# Patient Record
Sex: Male | Born: 1960 | Race: Black or African American | Hispanic: No | Marital: Single
Health system: Southern US, Community
[De-identification: ages and names within clinical notes are randomized; demographics above are authoritative.]

## PROBLEM LIST (undated history)

## (undated) DIAGNOSIS — I639 Cerebral infarction, unspecified: Secondary | ICD-10-CM

## (undated) DIAGNOSIS — R569 Unspecified convulsions: Secondary | ICD-10-CM

## (undated) DIAGNOSIS — K5909 Other constipation: Secondary | ICD-10-CM

## (undated) DIAGNOSIS — I255 Ischemic cardiomyopathy: Secondary | ICD-10-CM

## (undated) DIAGNOSIS — I10 Essential (primary) hypertension: Secondary | ICD-10-CM

## (undated) DIAGNOSIS — E119 Type 2 diabetes mellitus without complications: Secondary | ICD-10-CM

## (undated) HISTORY — PX: TRANSURETHRAL RESECTION OF PROSTATE: SHX73

---

## 2017-08-21 DIAGNOSIS — R079 Chest pain, unspecified: Secondary | ICD-10-CM

## 2017-08-21 DIAGNOSIS — I10 Essential (primary) hypertension: Secondary | ICD-10-CM

## 2017-08-21 DIAGNOSIS — R0602 Shortness of breath: Secondary | ICD-10-CM

## 2018-05-01 DIAGNOSIS — N412 Abscess of prostate: Secondary | ICD-10-CM | POA: Diagnosis not present

## 2018-05-01 DIAGNOSIS — N390 Urinary tract infection, site not specified: Secondary | ICD-10-CM

## 2018-05-01 DIAGNOSIS — E876 Hypokalemia: Secondary | ICD-10-CM | POA: Diagnosis not present

## 2018-05-01 DIAGNOSIS — N410 Acute prostatitis: Secondary | ICD-10-CM | POA: Diagnosis not present

## 2018-05-01 DIAGNOSIS — Z8673 Personal history of transient ischemic attack (TIA), and cerebral infarction without residual deficits: Secondary | ICD-10-CM

## 2018-05-01 DIAGNOSIS — E871 Hypo-osmolality and hyponatremia: Secondary | ICD-10-CM | POA: Diagnosis not present

## 2018-05-01 DIAGNOSIS — G40909 Epilepsy, unspecified, not intractable, without status epilepticus: Secondary | ICD-10-CM

## 2018-05-01 DIAGNOSIS — I10 Essential (primary) hypertension: Secondary | ICD-10-CM

## 2018-05-02 DIAGNOSIS — N412 Abscess of prostate: Secondary | ICD-10-CM | POA: Diagnosis not present

## 2018-05-02 DIAGNOSIS — E876 Hypokalemia: Secondary | ICD-10-CM | POA: Diagnosis not present

## 2018-05-02 DIAGNOSIS — E871 Hypo-osmolality and hyponatremia: Secondary | ICD-10-CM | POA: Diagnosis not present

## 2018-05-02 DIAGNOSIS — N410 Acute prostatitis: Secondary | ICD-10-CM | POA: Diagnosis not present

## 2018-05-03 DIAGNOSIS — N412 Abscess of prostate: Secondary | ICD-10-CM | POA: Diagnosis not present

## 2018-05-03 DIAGNOSIS — E871 Hypo-osmolality and hyponatremia: Secondary | ICD-10-CM | POA: Diagnosis not present

## 2018-05-03 DIAGNOSIS — E876 Hypokalemia: Secondary | ICD-10-CM | POA: Diagnosis not present

## 2018-05-03 DIAGNOSIS — N410 Acute prostatitis: Secondary | ICD-10-CM | POA: Diagnosis not present

## 2018-05-04 DIAGNOSIS — E871 Hypo-osmolality and hyponatremia: Secondary | ICD-10-CM | POA: Diagnosis not present

## 2018-05-04 DIAGNOSIS — E876 Hypokalemia: Secondary | ICD-10-CM | POA: Diagnosis not present

## 2018-05-04 DIAGNOSIS — N412 Abscess of prostate: Secondary | ICD-10-CM | POA: Diagnosis not present

## 2018-05-04 DIAGNOSIS — N410 Acute prostatitis: Secondary | ICD-10-CM | POA: Diagnosis not present

## 2018-05-05 DIAGNOSIS — E876 Hypokalemia: Secondary | ICD-10-CM | POA: Diagnosis not present

## 2018-05-05 DIAGNOSIS — N412 Abscess of prostate: Secondary | ICD-10-CM | POA: Diagnosis not present

## 2018-05-05 DIAGNOSIS — N410 Acute prostatitis: Secondary | ICD-10-CM | POA: Diagnosis not present

## 2018-05-05 DIAGNOSIS — E871 Hypo-osmolality and hyponatremia: Secondary | ICD-10-CM | POA: Diagnosis not present

## 2018-05-06 DIAGNOSIS — E876 Hypokalemia: Secondary | ICD-10-CM | POA: Diagnosis not present

## 2018-05-06 DIAGNOSIS — N412 Abscess of prostate: Secondary | ICD-10-CM | POA: Diagnosis not present

## 2018-05-06 DIAGNOSIS — E871 Hypo-osmolality and hyponatremia: Secondary | ICD-10-CM | POA: Diagnosis not present

## 2018-05-06 DIAGNOSIS — N410 Acute prostatitis: Secondary | ICD-10-CM | POA: Diagnosis not present

## 2018-05-07 DIAGNOSIS — E876 Hypokalemia: Secondary | ICD-10-CM | POA: Diagnosis not present

## 2018-05-07 DIAGNOSIS — E871 Hypo-osmolality and hyponatremia: Secondary | ICD-10-CM | POA: Diagnosis not present

## 2018-05-07 DIAGNOSIS — N412 Abscess of prostate: Secondary | ICD-10-CM | POA: Diagnosis not present

## 2018-05-07 DIAGNOSIS — N410 Acute prostatitis: Secondary | ICD-10-CM | POA: Diagnosis not present

## 2018-05-08 DIAGNOSIS — E876 Hypokalemia: Secondary | ICD-10-CM | POA: Diagnosis not present

## 2018-05-08 DIAGNOSIS — N412 Abscess of prostate: Secondary | ICD-10-CM | POA: Diagnosis not present

## 2018-05-08 DIAGNOSIS — E871 Hypo-osmolality and hyponatremia: Secondary | ICD-10-CM | POA: Diagnosis not present

## 2018-05-08 DIAGNOSIS — N410 Acute prostatitis: Secondary | ICD-10-CM | POA: Diagnosis not present

## 2019-07-14 ENCOUNTER — Other Ambulatory Visit
Admission: RE | Admit: 2019-07-14 | Discharge: 2019-07-14 | Disposition: A | Discharge status: Home / Self Care | Source: Ambulatory Visit | Attending: Internal Medicine | Admitting: Internal Medicine

## 2019-07-14 LAB — URINALYSIS, COMPLETE (UACMP) WITH MICROSCOPIC
Bacteria, UA: NONE SEEN
Bilirubin Urine: NEGATIVE
Glucose, UA: NEGATIVE mg/dL
Ketones, ur: 80 mg/dL — AB
Leukocytes,Ua: NEGATIVE
Nitrite: NEGATIVE
Protein, ur: NEGATIVE mg/dL
Specific Gravity, Urine: 1.016 (ref 1.005–1.030)
pH: 5 (ref 5.0–8.0)

## 2019-07-14 LAB — CBC WITH DIFFERENTIAL/PLATELET
Abs Immature Granulocytes: 0.03 10*3/uL (ref 0.00–0.07)
Basophils Absolute: 0 10*3/uL (ref 0.0–0.1)
Basophils Relative: 0 %
Eosinophils Absolute: 0.1 10*3/uL (ref 0.0–0.5)
Eosinophils Relative: 2 %
HCT: 36.2 % — ABNORMAL LOW (ref 39.0–52.0)
Hemoglobin: 11.6 g/dL — ABNORMAL LOW (ref 13.0–17.0)
Immature Granulocytes: 0 %
Lymphocytes Relative: 19 %
Lymphs Abs: 1.4 10*3/uL (ref 0.7–4.0)
MCH: 27.3 pg (ref 26.0–34.0)
MCHC: 32 g/dL (ref 30.0–36.0)
MCV: 85.2 fL (ref 80.0–100.0)
Monocytes Absolute: 0.8 10*3/uL (ref 0.1–1.0)
Monocytes Relative: 10 %
Neutro Abs: 5.2 10*3/uL (ref 1.7–7.7)
Neutrophils Relative %: 69 %
Platelets: 385 10*3/uL (ref 150–400)
RBC: 4.25 MIL/uL (ref 4.22–5.81)
RDW: 14.4 % (ref 11.5–15.5)
WBC: 7.5 10*3/uL (ref 4.0–10.5)
nRBC: 0 % (ref 0.0–0.2)

## 2019-07-14 LAB — COMPREHENSIVE METABOLIC PANEL
ALT: 11 U/L (ref 0–44)
AST: 10 U/L — ABNORMAL LOW (ref 15–41)
Albumin: 3.2 g/dL — ABNORMAL LOW (ref 3.5–5.0)
Alkaline Phosphatase: 89 U/L (ref 38–126)
Anion gap: 17 — ABNORMAL HIGH (ref 5–15)
BUN: 10 mg/dL (ref 6–20)
CO2: 24 mmol/L (ref 22–32)
Calcium: 8.8 mg/dL — ABNORMAL LOW (ref 8.9–10.3)
Chloride: 95 mmol/L — ABNORMAL LOW (ref 98–111)
Creatinine, Ser: 0.34 mg/dL — ABNORMAL LOW (ref 0.61–1.24)
GFR calc Af Amer: >60 mL/min (ref >60)
GFR calc non Af Amer: >60 mL/min (ref >60)
Glucose, Bld: 31 mg/dL — CL (ref 70–99)
Potassium: 3.7 mmol/L (ref 3.5–5.1)
Sodium: 136 mmol/L (ref 135–145)
Total Bilirubin: 0.9 mg/dL (ref 0.3–1.2)
Total Protein: 6.8 g/dL (ref 6.5–8.1)

## 2019-09-03 DIAGNOSIS — R569 Unspecified convulsions: Secondary | ICD-10-CM | POA: Diagnosis not present

## 2019-09-03 DIAGNOSIS — A419 Sepsis, unspecified organism: Secondary | ICD-10-CM | POA: Diagnosis not present

## 2019-09-03 DIAGNOSIS — J189 Pneumonia, unspecified organism: Secondary | ICD-10-CM | POA: Diagnosis not present

## 2019-09-03 DIAGNOSIS — J9621 Acute and chronic respiratory failure with hypoxia: Secondary | ICD-10-CM | POA: Diagnosis not present

## 2019-09-03 DIAGNOSIS — I255 Ischemic cardiomyopathy: Secondary | ICD-10-CM

## 2019-09-04 DIAGNOSIS — J9621 Acute and chronic respiratory failure with hypoxia: Secondary | ICD-10-CM | POA: Diagnosis not present

## 2019-09-04 DIAGNOSIS — I255 Ischemic cardiomyopathy: Secondary | ICD-10-CM

## 2019-09-04 DIAGNOSIS — R569 Unspecified convulsions: Secondary | ICD-10-CM | POA: Diagnosis not present

## 2019-09-04 DIAGNOSIS — A419 Sepsis, unspecified organism: Secondary | ICD-10-CM | POA: Diagnosis not present

## 2019-09-04 DIAGNOSIS — J189 Pneumonia, unspecified organism: Secondary | ICD-10-CM | POA: Diagnosis not present

## 2019-09-05 DIAGNOSIS — R569 Unspecified convulsions: Secondary | ICD-10-CM | POA: Diagnosis not present

## 2019-09-05 DIAGNOSIS — J189 Pneumonia, unspecified organism: Secondary | ICD-10-CM | POA: Diagnosis not present

## 2019-09-05 DIAGNOSIS — J9621 Acute and chronic respiratory failure with hypoxia: Secondary | ICD-10-CM | POA: Diagnosis not present

## 2019-09-05 DIAGNOSIS — I255 Ischemic cardiomyopathy: Secondary | ICD-10-CM

## 2019-09-05 DIAGNOSIS — A419 Sepsis, unspecified organism: Secondary | ICD-10-CM | POA: Diagnosis not present

## 2019-09-06 DIAGNOSIS — R569 Unspecified convulsions: Secondary | ICD-10-CM | POA: Diagnosis not present

## 2019-09-06 DIAGNOSIS — A419 Sepsis, unspecified organism: Secondary | ICD-10-CM | POA: Diagnosis not present

## 2019-09-06 DIAGNOSIS — J189 Pneumonia, unspecified organism: Secondary | ICD-10-CM | POA: Diagnosis not present

## 2019-09-06 DIAGNOSIS — J9621 Acute and chronic respiratory failure with hypoxia: Secondary | ICD-10-CM | POA: Diagnosis not present

## 2019-09-06 DIAGNOSIS — I255 Ischemic cardiomyopathy: Secondary | ICD-10-CM

## 2019-09-07 DIAGNOSIS — J9621 Acute and chronic respiratory failure with hypoxia: Secondary | ICD-10-CM | POA: Diagnosis not present

## 2019-09-07 DIAGNOSIS — R569 Unspecified convulsions: Secondary | ICD-10-CM | POA: Diagnosis not present

## 2019-09-07 DIAGNOSIS — J189 Pneumonia, unspecified organism: Secondary | ICD-10-CM | POA: Diagnosis not present

## 2019-09-07 DIAGNOSIS — A419 Sepsis, unspecified organism: Secondary | ICD-10-CM | POA: Diagnosis not present

## 2019-09-07 DIAGNOSIS — I255 Ischemic cardiomyopathy: Secondary | ICD-10-CM

## 2019-09-08 DIAGNOSIS — J9621 Acute and chronic respiratory failure with hypoxia: Secondary | ICD-10-CM | POA: Diagnosis not present

## 2019-09-08 DIAGNOSIS — I255 Ischemic cardiomyopathy: Secondary | ICD-10-CM

## 2019-09-08 DIAGNOSIS — J189 Pneumonia, unspecified organism: Secondary | ICD-10-CM | POA: Diagnosis not present

## 2019-09-08 DIAGNOSIS — A419 Sepsis, unspecified organism: Secondary | ICD-10-CM | POA: Diagnosis not present

## 2019-09-08 DIAGNOSIS — R569 Unspecified convulsions: Secondary | ICD-10-CM | POA: Diagnosis not present

## 2020-04-01 ENCOUNTER — Emergency Department (HOSPITAL_COMMUNITY)

## 2020-04-01 ENCOUNTER — Other Ambulatory Visit: Payer: Self-pay

## 2020-04-01 ENCOUNTER — Emergency Department (HOSPITAL_COMMUNITY)
Admission: EM | Admit: 2020-04-01 | Discharge: 2020-04-01 | Disposition: A | Discharge status: Other Acute Inpatient Hospital | Attending: Emergency Medicine | Admitting: Emergency Medicine

## 2020-04-01 ENCOUNTER — Encounter (HOSPITAL_COMMUNITY): Payer: Self-pay | Admitting: *Deleted

## 2020-04-01 DIAGNOSIS — E119 Type 2 diabetes mellitus without complications: Secondary | ICD-10-CM | POA: Diagnosis not present

## 2020-04-01 DIAGNOSIS — I639 Cerebral infarction, unspecified: Secondary | ICD-10-CM

## 2020-04-01 DIAGNOSIS — H5789 Other specified disorders of eye and adnexa: Secondary | ICD-10-CM | POA: Diagnosis not present

## 2020-04-01 DIAGNOSIS — R531 Weakness: Secondary | ICD-10-CM | POA: Diagnosis not present

## 2020-04-01 DIAGNOSIS — H538 Other visual disturbances: Secondary | ICD-10-CM | POA: Insufficient documentation

## 2020-04-01 DIAGNOSIS — I10 Essential (primary) hypertension: Secondary | ICD-10-CM | POA: Insufficient documentation

## 2020-04-01 DIAGNOSIS — Z20822 Contact with and (suspected) exposure to covid-19: Secondary | ICD-10-CM | POA: Insufficient documentation

## 2020-04-01 HISTORY — DX: Other constipation: K59.09

## 2020-04-01 HISTORY — DX: Type 2 diabetes mellitus without complications: E11.9

## 2020-04-01 HISTORY — DX: Essential (primary) hypertension: I10

## 2020-04-01 HISTORY — DX: Cerebral infarction, unspecified: I63.9

## 2020-04-01 HISTORY — DX: Ischemic cardiomyopathy: I25.5

## 2020-04-01 HISTORY — DX: Unspecified convulsions: R56.9

## 2020-04-01 LAB — COMPREHENSIVE METABOLIC PANEL
ALT: 33 U/L (ref 0–44)
AST: 19 U/L (ref 15–41)
Albumin: 3.4 g/dL — ABNORMAL LOW (ref 3.5–5.0)
Alkaline Phosphatase: 86 U/L (ref 38–126)
Anion gap: 9 (ref 5–15)
BUN: 13 mg/dL (ref 6–20)
CO2: 32 mmol/L (ref 22–32)
Calcium: 9.2 mg/dL (ref 8.9–10.3)
Chloride: 97 mmol/L — ABNORMAL LOW (ref 98–111)
Creatinine, Ser: 0.31 mg/dL — ABNORMAL LOW (ref 0.61–1.24)
GFR calc Af Amer: >60 mL/min (ref >60)
GFR calc non Af Amer: >60 mL/min (ref >60)
Glucose, Bld: 93 mg/dL (ref 70–99)
Potassium: 4.3 mmol/L (ref 3.5–5.1)
Sodium: 138 mmol/L (ref 135–145)
Total Bilirubin: 0.5 mg/dL (ref 0.3–1.2)
Total Protein: 7.1 g/dL (ref 6.5–8.1)

## 2020-04-01 LAB — APTT: aPTT: 45 seconds — ABNORMAL HIGH (ref 24–36)

## 2020-04-01 LAB — DIFFERENTIAL
Abs Immature Granulocytes: 0.02 10*3/uL (ref 0.00–0.07)
Basophils Absolute: 0 10*3/uL (ref 0.0–0.1)
Basophils Relative: 0 %
Eosinophils Absolute: 0.2 10*3/uL (ref 0.0–0.5)
Eosinophils Relative: 2 %
Immature Granulocytes: 0 %
Lymphocytes Relative: 30 %
Lymphs Abs: 2 10*3/uL (ref 0.7–4.0)
Monocytes Absolute: 0.5 10*3/uL (ref 0.1–1.0)
Monocytes Relative: 8 %
Neutro Abs: 4 10*3/uL (ref 1.7–7.7)
Neutrophils Relative %: 60 %

## 2020-04-01 LAB — CBC
HCT: 39.8 % (ref 39.0–52.0)
Hemoglobin: 12 g/dL — ABNORMAL LOW (ref 13.0–17.0)
MCH: 26.7 pg (ref 26.0–34.0)
MCHC: 30.2 g/dL (ref 30.0–36.0)
MCV: 88.4 fL (ref 80.0–100.0)
Platelets: 198 10*3/uL (ref 150–400)
RBC: 4.5 MIL/uL (ref 4.22–5.81)
RDW: 15 % (ref 11.5–15.5)
WBC: 6.7 10*3/uL (ref 4.0–10.5)
nRBC: 0 % (ref 0.0–0.2)

## 2020-04-01 LAB — I-STAT CHEM 8, ED
BUN: 14 mg/dL (ref 6–20)
Calcium, Ion: 1.13 mmol/L — ABNORMAL LOW (ref 1.15–1.40)
Chloride: 96 mmol/L — ABNORMAL LOW (ref 98–111)
Creatinine, Ser: 0.3 mg/dL — ABNORMAL LOW (ref 0.61–1.24)
Glucose, Bld: 89 mg/dL (ref 70–99)
HCT: 39 % (ref 39.0–52.0)
Hemoglobin: 13.3 g/dL (ref 13.0–17.0)
Potassium: 4.2 mmol/L (ref 3.5–5.1)
Sodium: 139 mmol/L (ref 135–145)
TCO2: 31 mmol/L (ref 22–32)

## 2020-04-01 LAB — PROTIME-INR
INR: 1.1 (ref 0.8–1.2)
Prothrombin Time: 13.3 seconds (ref 11.4–15.2)

## 2020-04-01 LAB — SARS CORONAVIRUS 2 BY RT PCR (HOSPITAL ORDER, PERFORMED IN ~~LOC~~ HOSPITAL LAB): SARS Coronavirus 2: NEGATIVE

## 2020-04-01 LAB — CBG MONITORING, ED: Glucose-Capillary: 97 mg/dL (ref 70–99)

## 2020-04-01 IMAGING — MR MR MRA NECK WO/W CM
5 of 9 series · 27 of 48 positions shown · IV contrast (gadavist)
Comparison: CT head [DATE]

CLINICAL DATA: Acute neurologic deficit.  Left-sided weakness.

EXAM:
MR HEAD WITHOUT CONTRAST
MR CIRCLE OF WILLIS WITHOUT CONTRAST
MRA OF THE NECK WITHOUT AND WITH CONTRAST
TECHNIQUE: Multiplanar, multiecho pulse sequences of the brain, circle of
willis and surrounding structures were obtained without intravenous
contrast. Angiographic images of the neck were obtained using MRA
technique without and with intravenous contrast.
CONTRAST:  6.8mL GADAVIST GADOBUTROL 1 MMOL/ML IV SOLN

[Series 8: tof_fl3d_tra_iso · axial · B · 0.6mm · 0.52mm/px · z∈[-165,-86]mm · 8 of 133 slices shown]
[im 1/133]
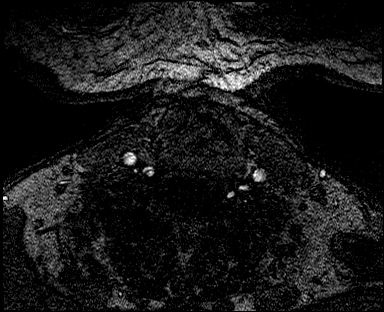
[im 19/133]
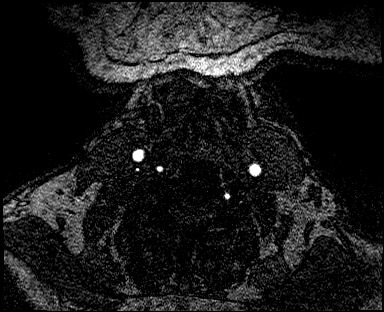
[im 38/133]
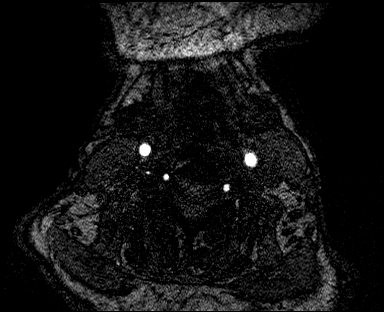
[im 57/133]
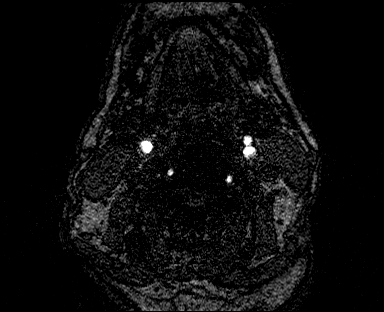
[im 76/133]
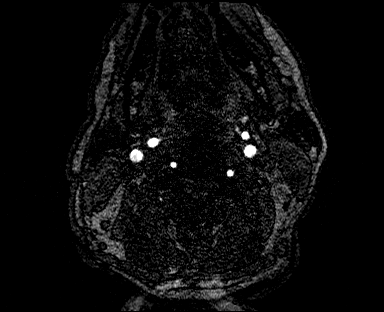
[im 95/133]
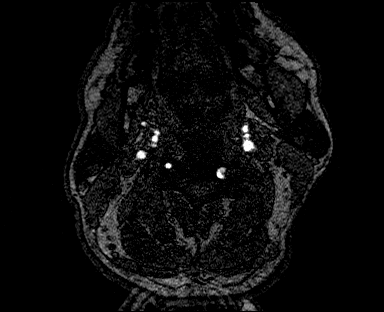
[im 114/133]
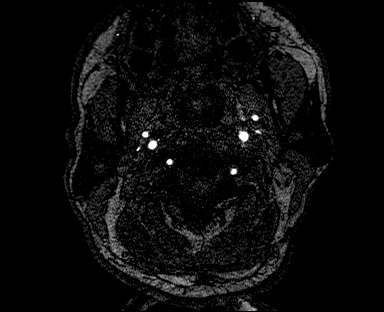
[im 133/133]
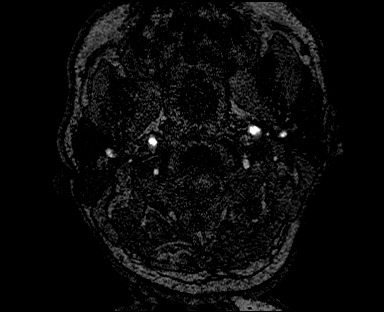

[Series 11: angio_fl3d_cor_highres_pre_ttc=3.0s · coronal · B · 0.9mm · 0.62mm/px · 5 of 88 slices shown]
[im 1/88]
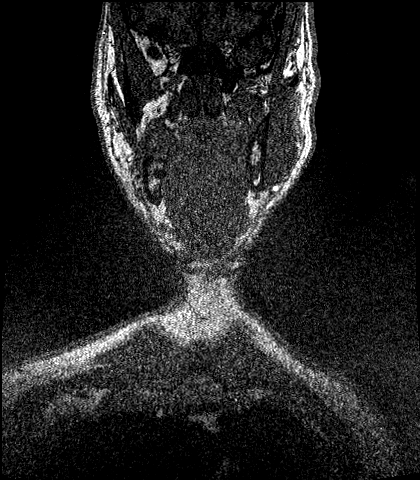
[im 22/88]
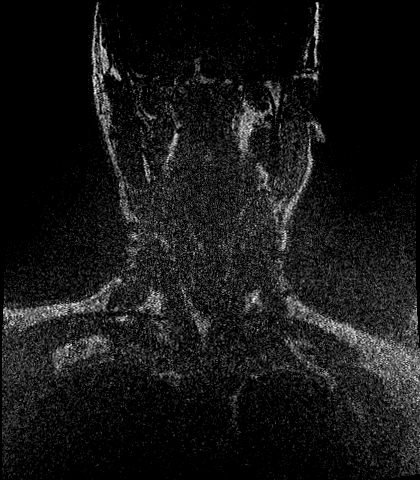
[im 44/88]
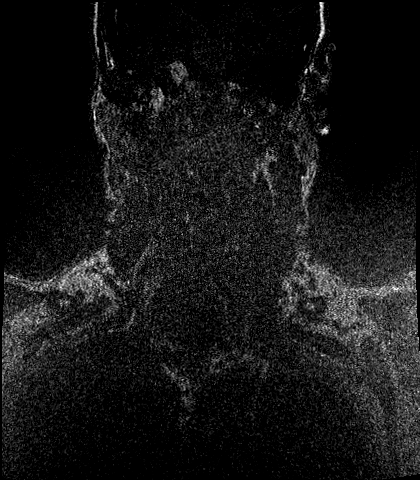
[im 66/88]
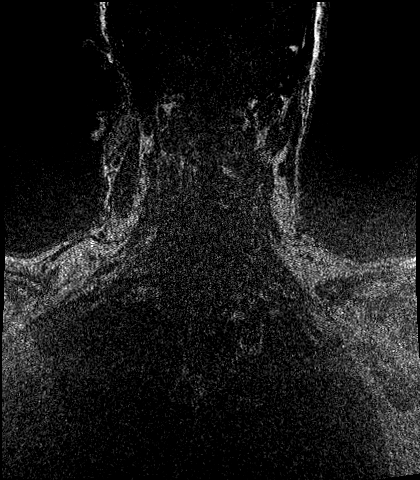
[im 88/88]
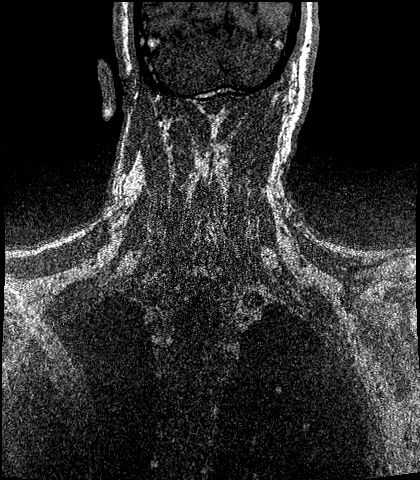

[Series 13: angio_fl3d_cor_highres_post_ttc=3.0s · coronal · B · 0.9mm · 0.62mm/px · 5 of 88 slices shown]
[im 1/88]
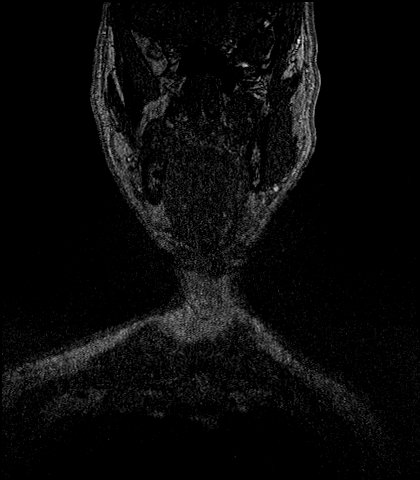
[im 22/88]
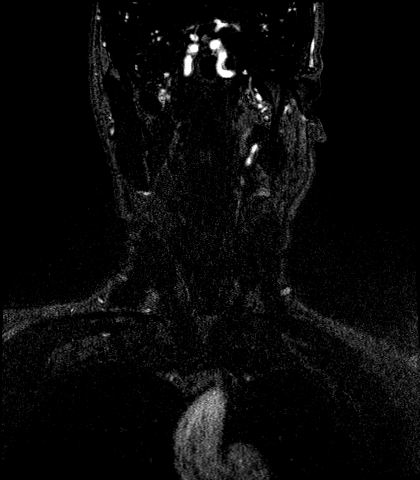
[im 44/88]
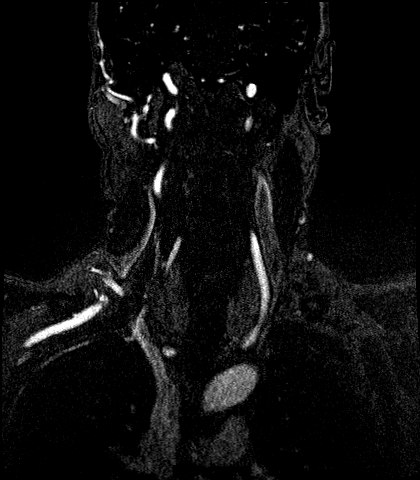
[im 66/88]
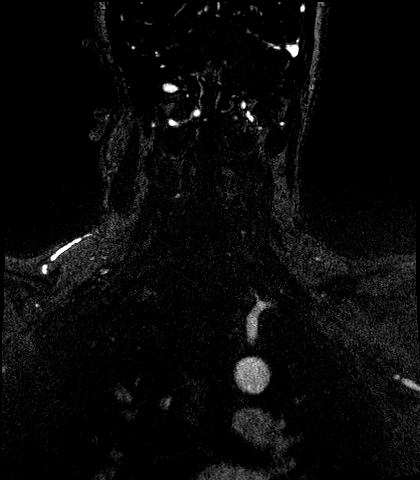
[im 88/88]
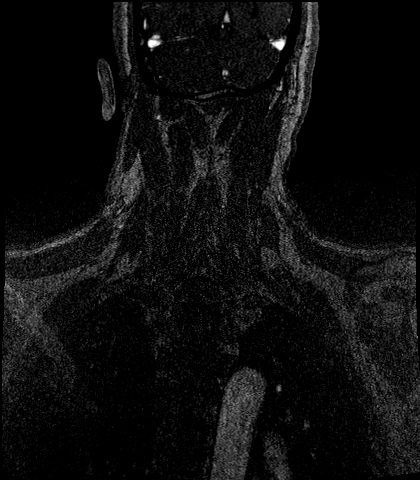

[Series 14: angio_fl3d_cor_highres_post_ttc=3.0s_moco-adv · coronal · B · 0.9mm · 0.62mm/px · 5 of 88 slices shown]
[im 1/88]
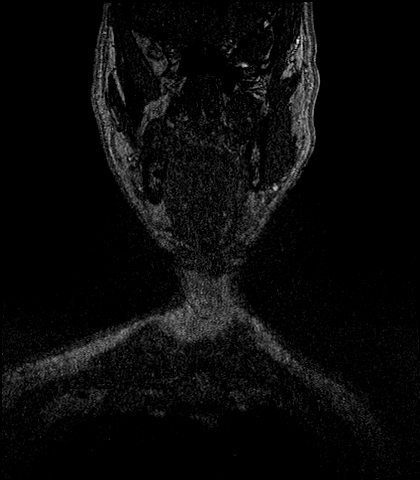
[im 22/88]
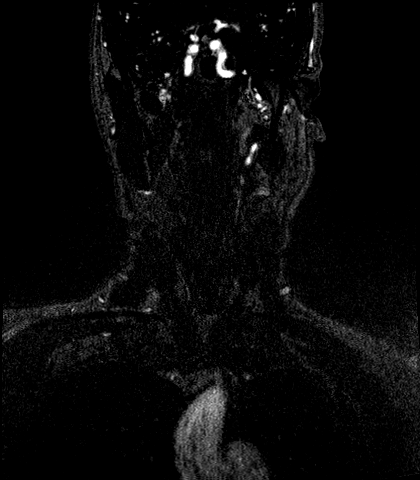
[im 44/88]
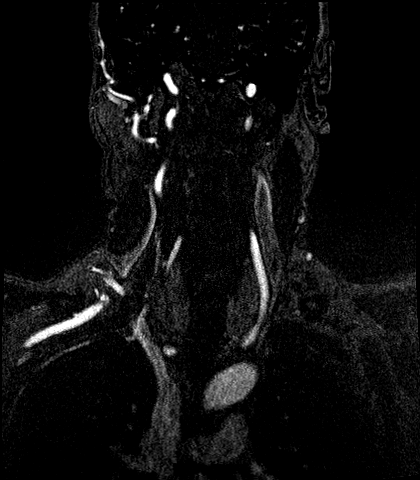
[im 66/88]
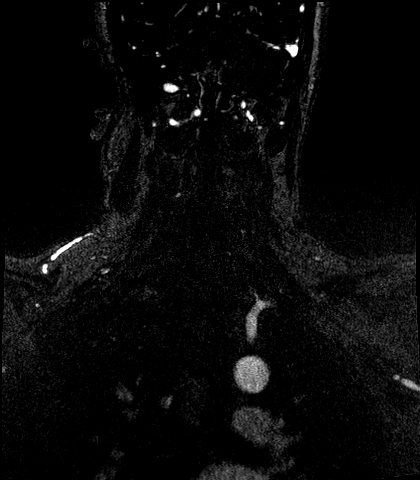
[im 88/88]
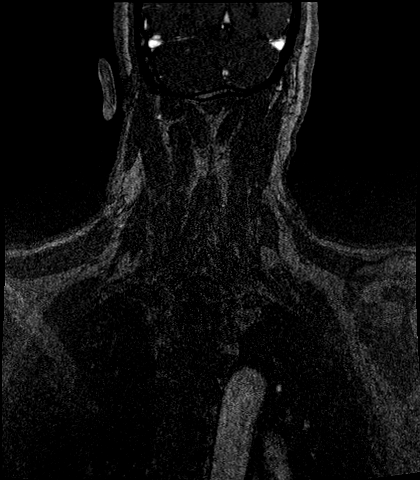

[Series 15: angio_fl3d_cor_highres_post_ttc=3.0s_moco-adv_sub · coronal · B · 0.9mm · 0.62mm/px · 4 of 88 slices shown]
[im 1/88]
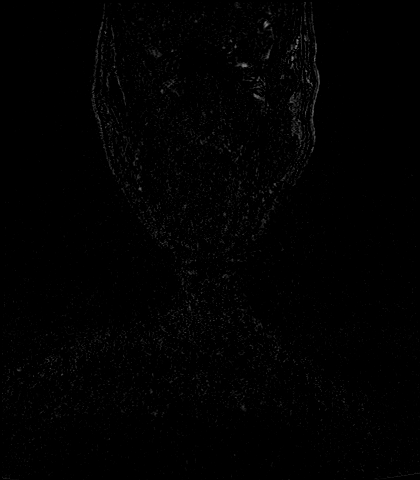
[im 18/88]
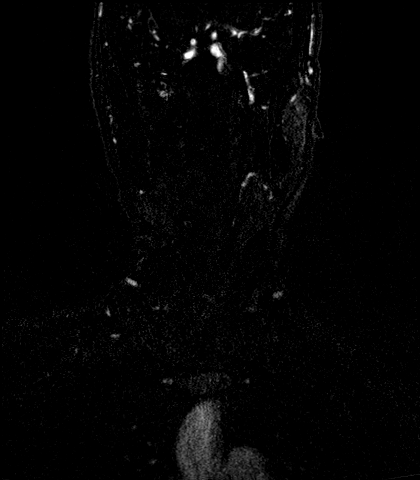
[im 35/88]
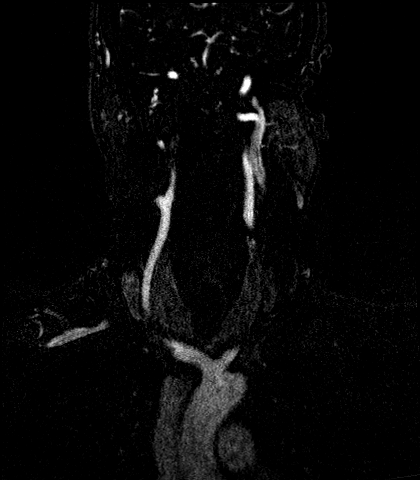
[im 53/88]
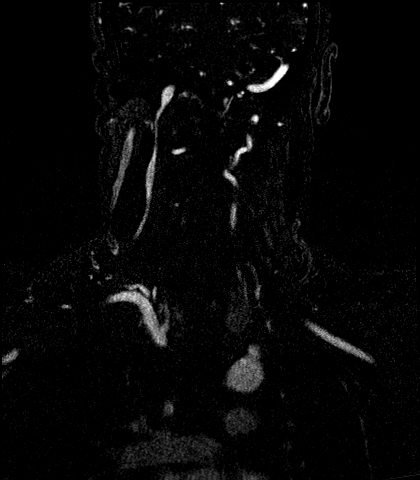

[27 of 48 positions shown; findings below may reference images not displayed]

FINDINGS: MR HEAD FINDINGS

Brain: Negative for acute infarct.

Moderate atrophy without hydrocephalus. Periventricular and deep
white matter hyperintensity bilaterally compatible with chronic
white matter disease. The patient has diabetes and hypertension in
this is most likely chronic microvascular ischemia although
demyelinating disease is in the differential. Negative for
hemorrhage or mass.

Vascular: Normal arterial flow voids

Skull and upper cervical spine: No focal skeletal lesion. ACDF at
C3-4.

Sinuses/Orbits: Mild mucosal edema paranasal sinuses. Negative orbit

Other: None

MR CIRCLE OF WILLIS FINDINGS

Both vertebral arteries widely patent to the basilar. PICA patent
bilaterally. Basilar patent. Left AICA patent. Bilateral superior
cerebellar and posterior cerebral arteries widely patent. Fetal
origin right posterior cerebral artery.

Internal carotid artery normal bilaterally without stenosis or
aneurysm. Middle cerebral artery widely patent bilaterally. Both
anterior cerebral arteries patent. Left anterior cerebral artery
large on the right. There is a mild to moderate stenosis of the
right A2 segment.

MRA NECK FINDINGS

Normal aortic arch with bovine branching. Proximal great vessels
widely patent.

Carotid bifurcation widely patent bilaterally without stenosis or
irregularity.

Both vertebral arteries widely patent without stenosis.
IMPRESSION: 1. Negative for acute infarct
2. Moderate atrophy. Extensive periventricular deep white matter
hyperintensities most likely chronic microvascular ischemia given
history of diabetes and hypertension.
3. Negative MRA neck.
4. Moderate stenosis right A2 segment. Remainder of the
circle-of-Willis is widely patent.

## 2020-04-01 IMAGING — CT CT HEAD CODE STROKE
3 series · 14 of 47 positions shown, 16 images · non-contrast
Comparison: Head CT [DATE].

CLINICAL DATA: Code stroke. 59-year-old male with left side
weakness. Last seen well at noon.

EXAM:
CT HEAD WITHOUT CONTRAST
TECHNIQUE: Contiguous axial images were obtained from the base of the skull
through the vertex without intravenous contrast.

[Series 3: head 5.0 h30s · axial · 0.41mm/px · z∈[-146,-16]mm · 8 of 32 slices shown, 10 images]
[im 3/32  brain]
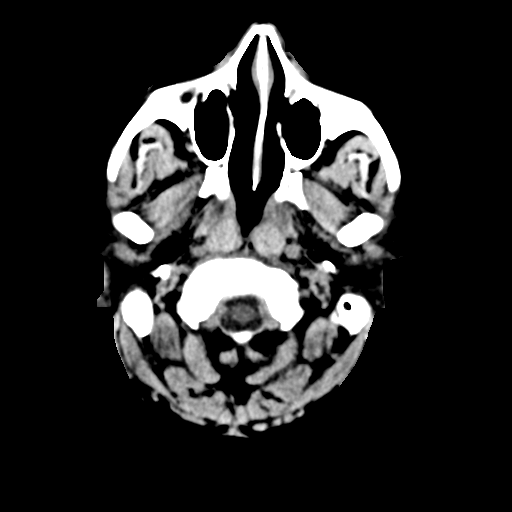
[im 3/32  bone]
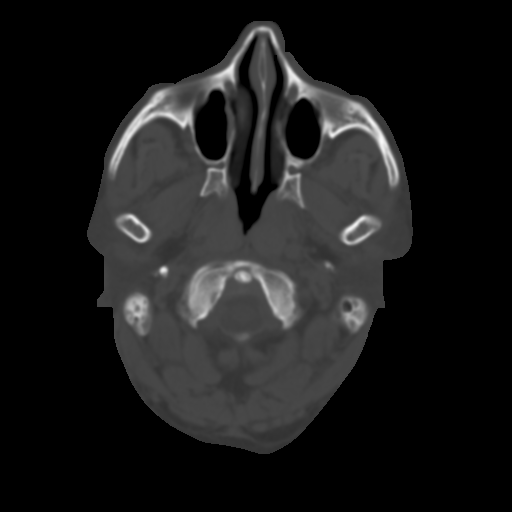
[im 7/32  brain]
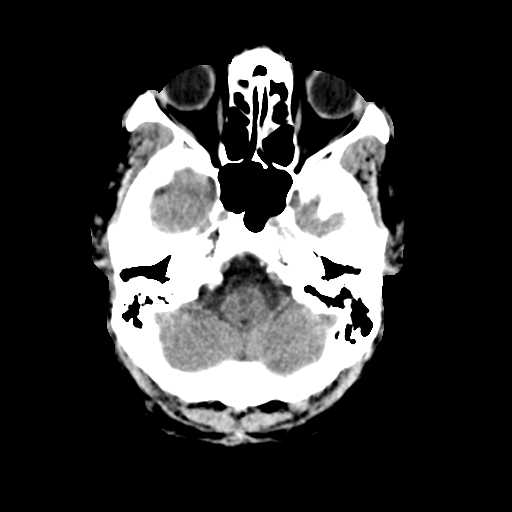
[im 10/32  brain]
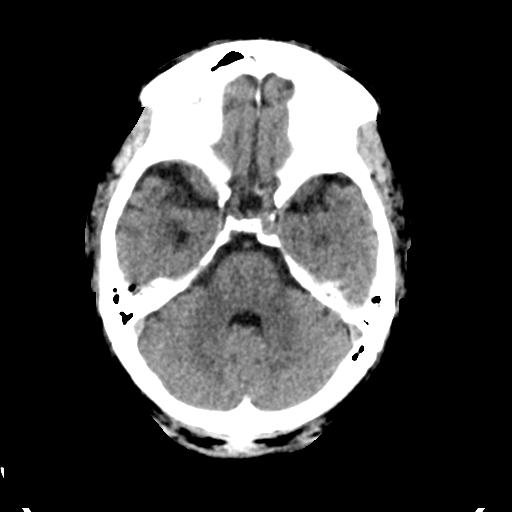
[im 14/32  brain]
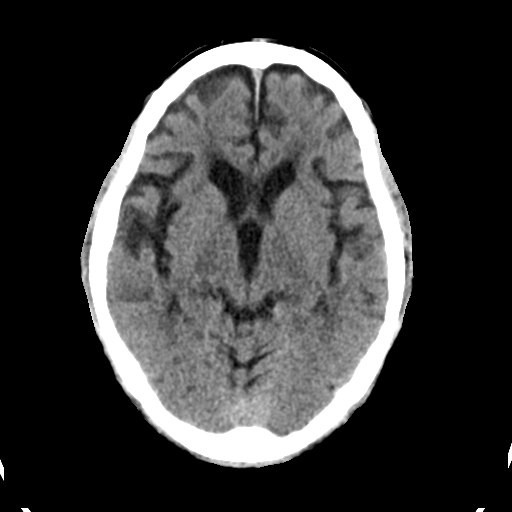
[im 18/32  brain]
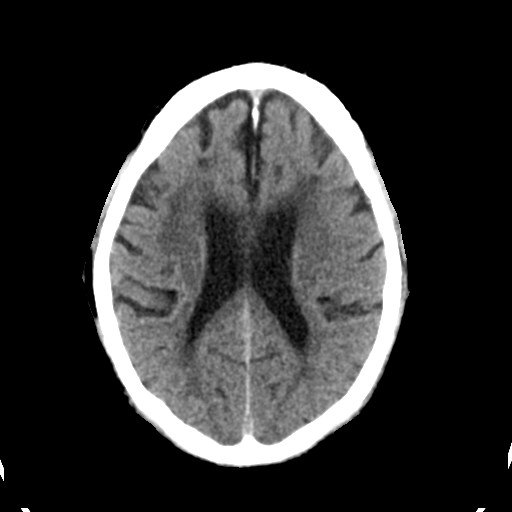
[im 18/32  bone]
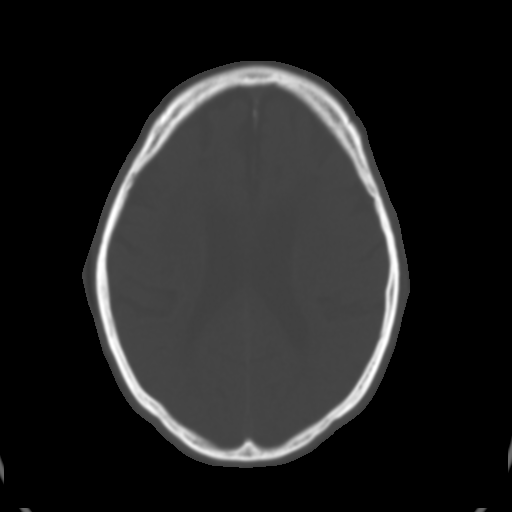
[im 22/32  brain]
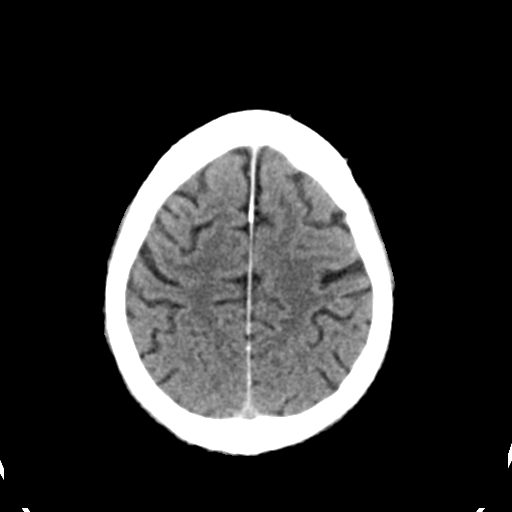
[im 25/32  brain]
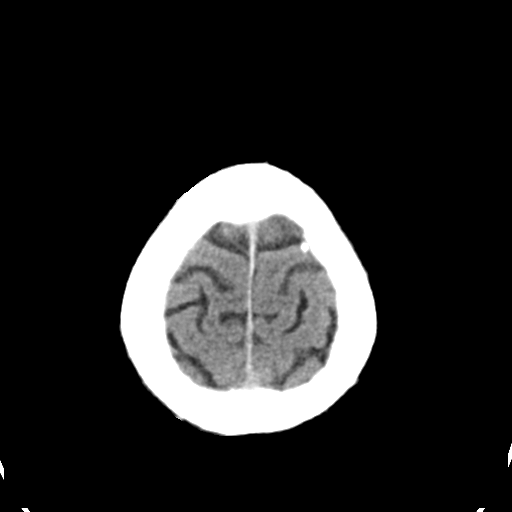
[im 29/32  brain]
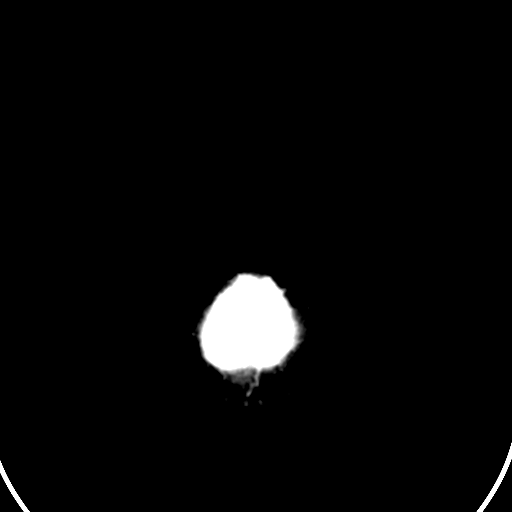

[Series 5: head 3.0 mpr cor · coronal · 0.30mm/px · 3 of 67 slices shown]
[im 23/67  brain]
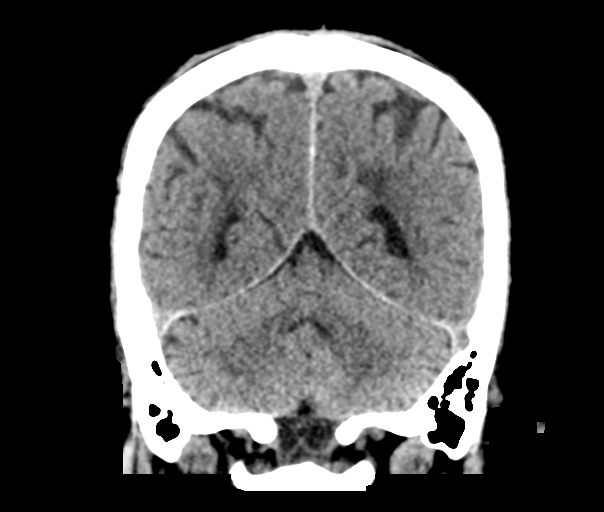
[im 30/67  brain]
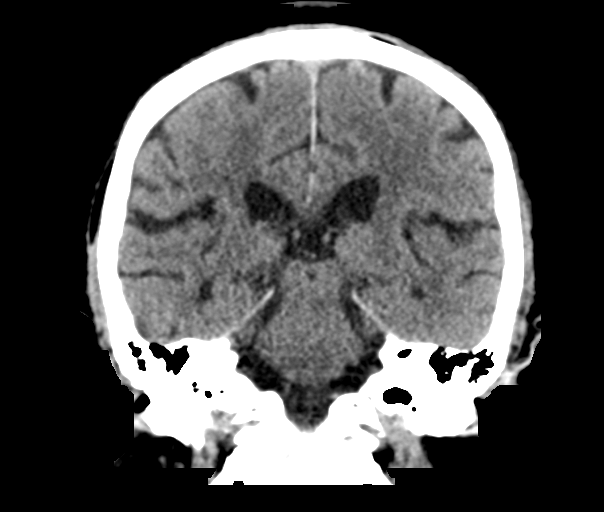
[im 37/67  brain]
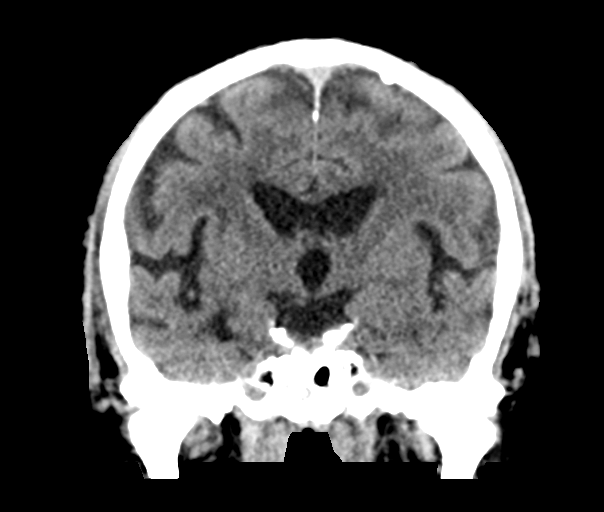

[Series 6: head 3.0 mpr sag · sagittal · 0.30mm/px · 3 of 61 slices shown]
[im 21/61  brain]
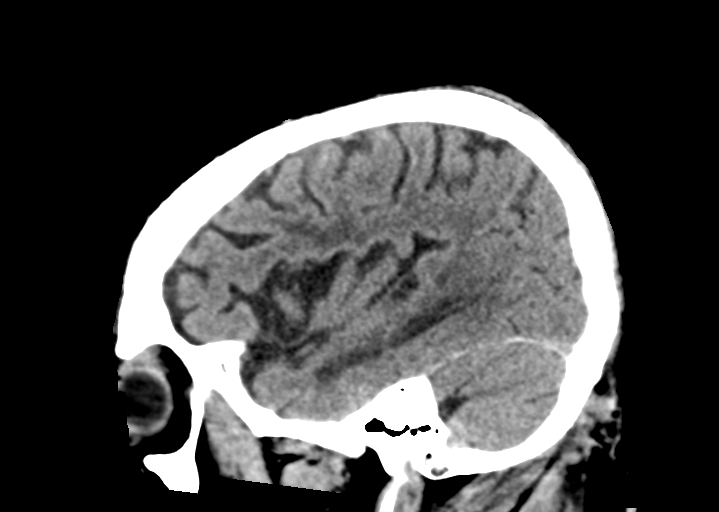
[im 31/61  brain]
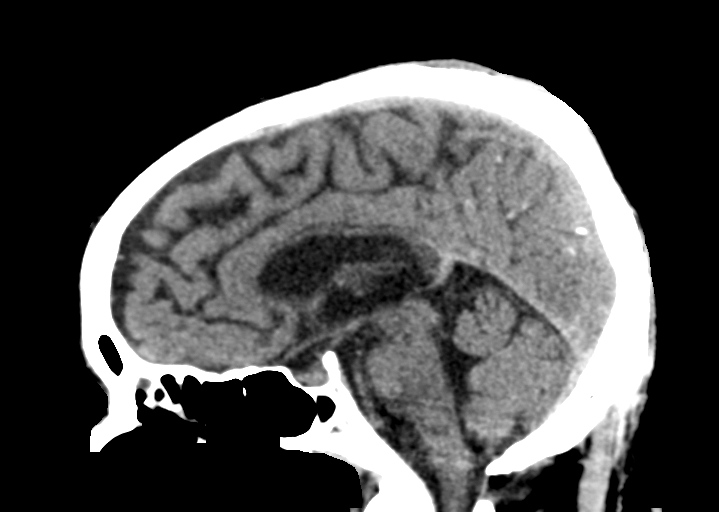
[im 41/61  brain]
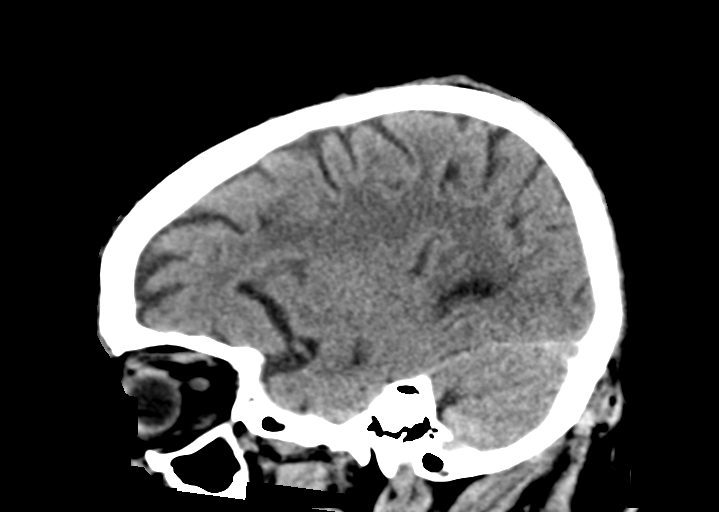

[14 of 47 positions shown; findings below may reference images not displayed]

FINDINGS: Brain: Stable mild dural calcification. No midline shift, mass
effect, or evidence of intracranial mass lesion. No
ventriculomegaly.

No acute intracranial hemorrhage identified. No cortically based
acute infarct identified. Increased patchy and scattered bilateral
white matter hypodensity since [XT], including some deep white
matter capsule involvement in both hemispheres. No cortical
encephalomalacia identified.

Vascular: No suspicious intracranial vascular hyperdensity.

Skull: No acute osseous abnormality identified.

Sinuses/Orbits: Mild new ethmoid sinus mucosal thickening. Other
paranasal sinuses are stable and well pneumatized. Chronic right
mastoid effusion is stable. Tympanic cavities remain clear.

Other: No acute orbit or scalp soft tissue finding.

ASPECTS ([place] Stroke Program Early CT Score)

Total score (0-10 with 10 being normal): 10
IMPRESSION: 1. No acute cortically based infarct or acute intracranial
hemorrhage identified. ASPECTS 10.
2. Increased cerebral white matter disease since [XT], most commonly
due to chronic small vessel disease.
3. These results were communicated to Dr. ANET at [DATE] on
[DATE] by text page via the [employer] messaging system.

## 2020-04-01 IMAGING — MR MR MRA HEAD W/O CM
1 series · 19 of 48 positions shown · IV contrast (gadavist)
Comparison: CT head [DATE]

CLINICAL DATA: Acute neurologic deficit.  Left-sided weakness.

EXAM:
MR HEAD WITHOUT CONTRAST
MR CIRCLE OF WILLIS WITHOUT CONTRAST
MRA OF THE NECK WITHOUT AND WITH CONTRAST
TECHNIQUE: Multiplanar, multiecho pulse sequences of the brain, circle of
willis and surrounding structures were obtained without intravenous
contrast. Angiographic images of the neck were obtained using MRA
technique without and with intravenous contrast.
CONTRAST:  6.8mL GADAVIST GADOBUTROL 1 MMOL/ML IV SOLN

[Series 5: 3d cow · axial · 0.5mm · 0.41mm/px · z∈[-99,-19]mm · 19 of 172 slices shown]
[im 1/172]
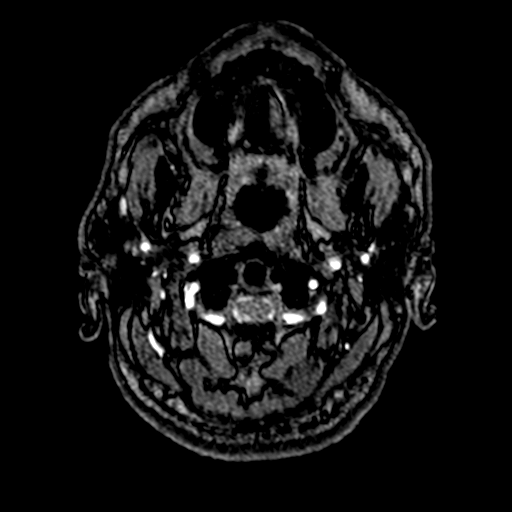
[im 4/172]
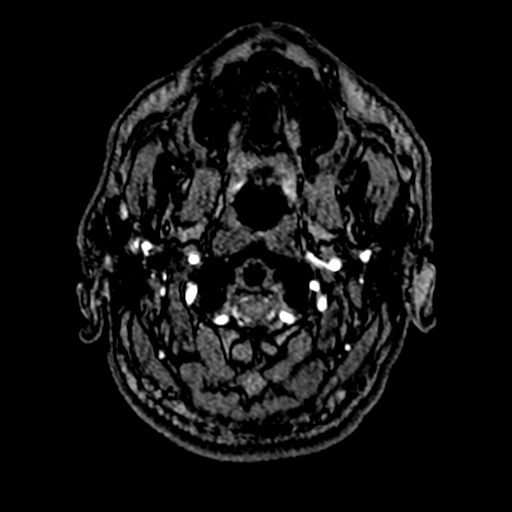
[im 8/172]
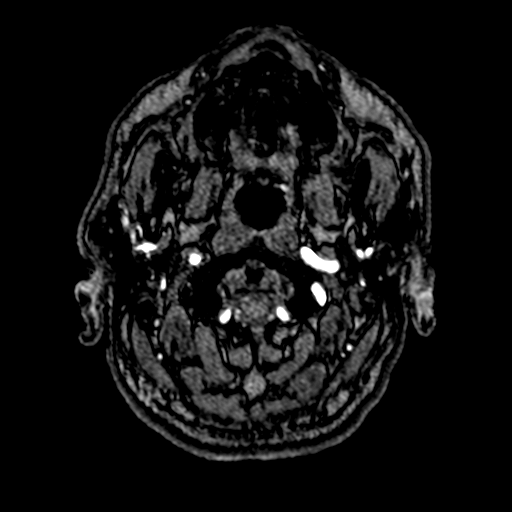
[im 11/172]
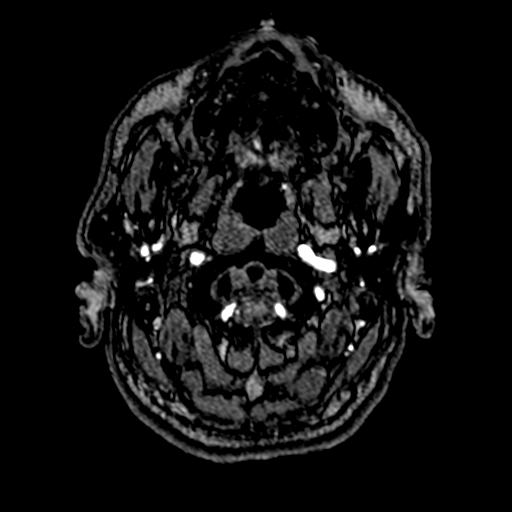
[im 15/172]
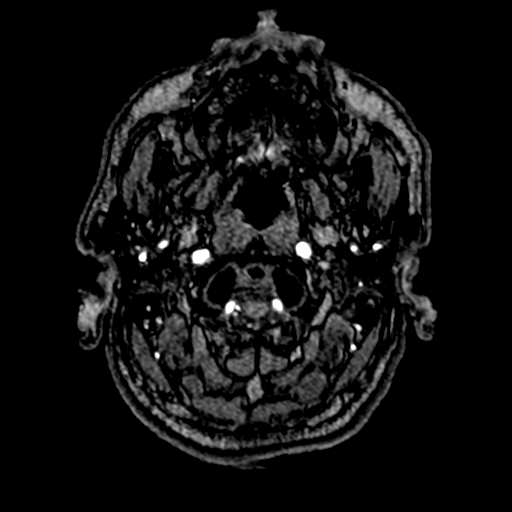
[im 19/172]
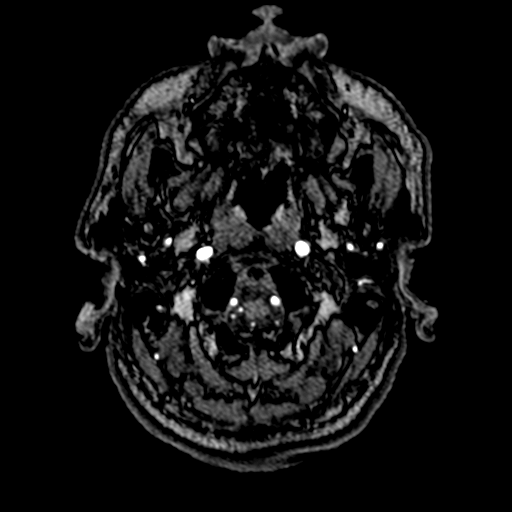
[im 22/172]
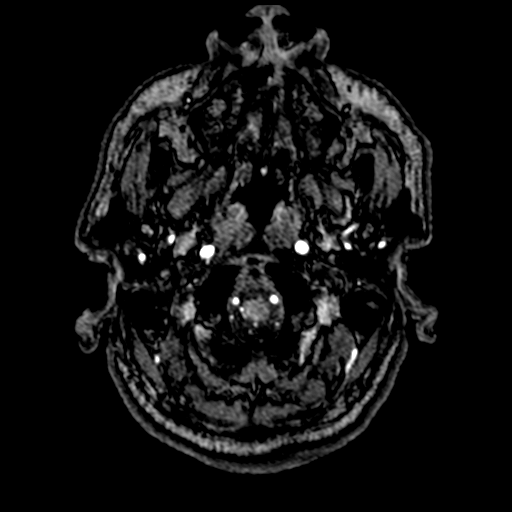
[im 26/172]
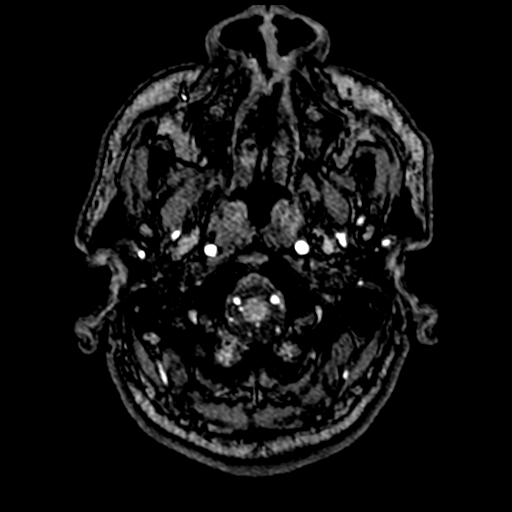
[im 30/172]
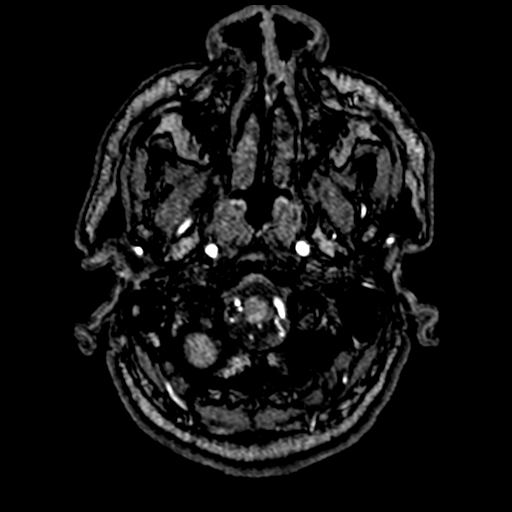
[im 33/172]
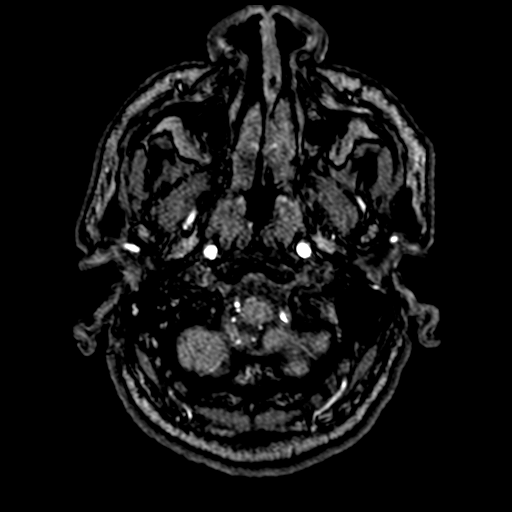
[im 37/172]
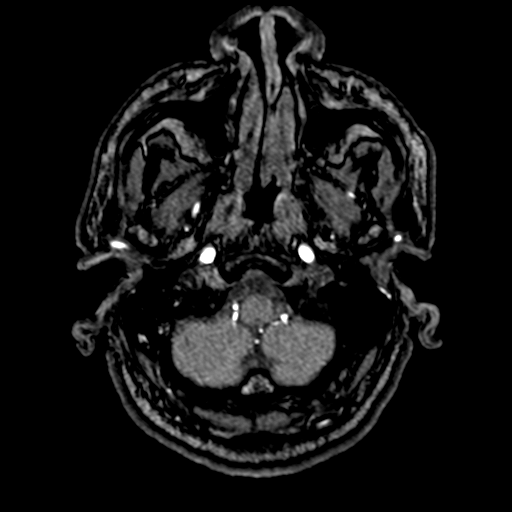
[im 55/172]
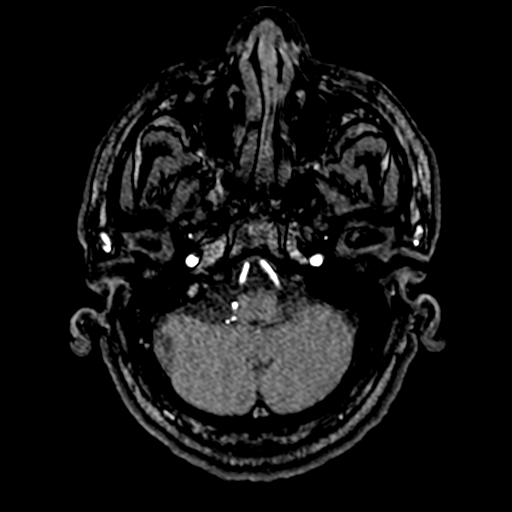
[im 77/172]
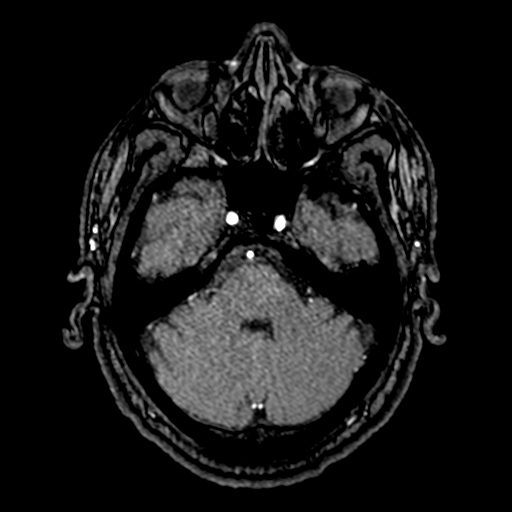
[im 88/172]
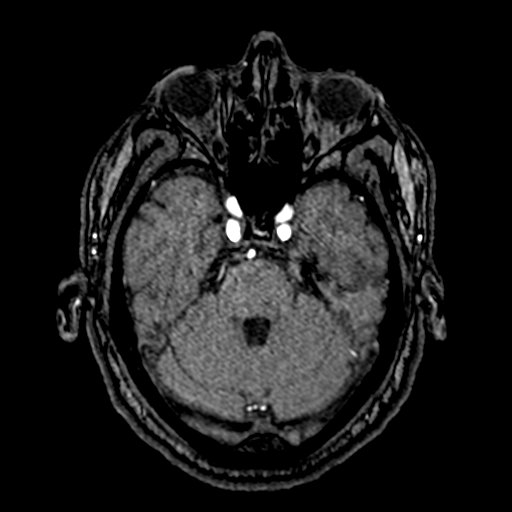
[im 99/172]
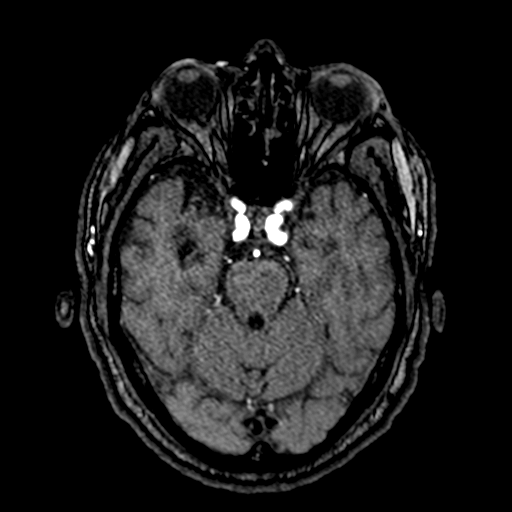
[im 121/172]
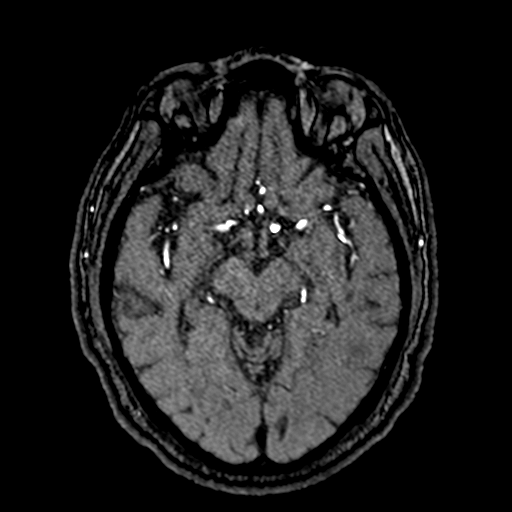
[im 142/172]
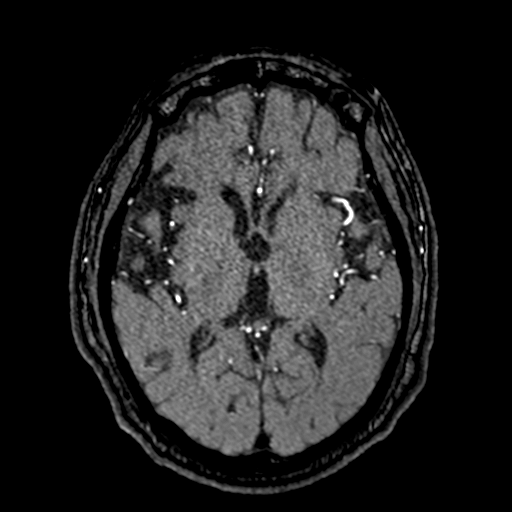
[im 146/172]
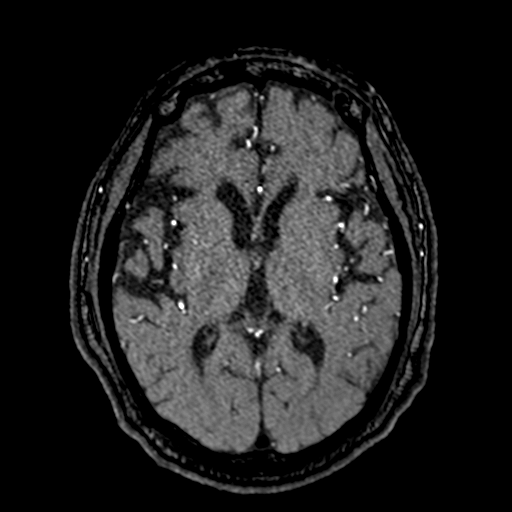
[im 164/172]
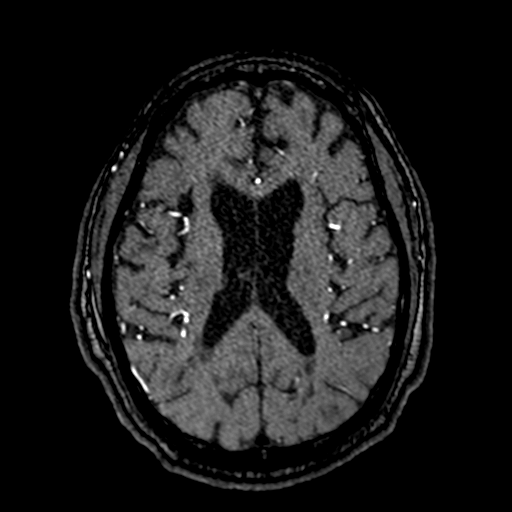

[19 of 48 positions shown; findings below may reference images not displayed]

FINDINGS: MR HEAD FINDINGS

Brain: Negative for acute infarct.

Moderate atrophy without hydrocephalus. Periventricular and deep
white matter hyperintensity bilaterally compatible with chronic
white matter disease. The patient has diabetes and hypertension in
this is most likely chronic microvascular ischemia although
demyelinating disease is in the differential. Negative for
hemorrhage or mass.

Vascular: Normal arterial flow voids

Skull and upper cervical spine: No focal skeletal lesion. ACDF at
C3-4.

Sinuses/Orbits: Mild mucosal edema paranasal sinuses. Negative orbit

Other: None

MR CIRCLE OF WILLIS FINDINGS

Both vertebral arteries widely patent to the basilar. PICA patent
bilaterally. Basilar patent. Left AICA patent. Bilateral superior
cerebellar and posterior cerebral arteries widely patent. Fetal
origin right posterior cerebral artery.

Internal carotid artery normal bilaterally without stenosis or
aneurysm. Middle cerebral artery widely patent bilaterally. Both
anterior cerebral arteries patent. Left anterior cerebral artery
large on the right. There is a mild to moderate stenosis of the
right A2 segment.

MRA NECK FINDINGS

Normal aortic arch with bovine branching. Proximal great vessels
widely patent.

Carotid bifurcation widely patent bilaterally without stenosis or
irregularity.

Both vertebral arteries widely patent without stenosis.
IMPRESSION: 1. Negative for acute infarct
2. Moderate atrophy. Extensive periventricular deep white matter
hyperintensities most likely chronic microvascular ischemia given
history of diabetes and hypertension.
3. Negative MRA neck.
4. Moderate stenosis right A2 segment. Remainder of the
circle-of-Willis is widely patent.

## 2020-04-01 IMAGING — MR MR HEAD W/O CM
12 of 13 series · 44 of 48 positions shown · IV contrast (gadavist)
Comparison: CT head [DATE]

CLINICAL DATA: Acute neurologic deficit.  Left-sided weakness.

EXAM:
MR HEAD WITHOUT CONTRAST
MR CIRCLE OF WILLIS WITHOUT CONTRAST
MRA OF THE NECK WITHOUT AND WITH CONTRAST
TECHNIQUE: Multiplanar, multiecho pulse sequences of the brain, circle of
willis and surrounding structures were obtained without intravenous
contrast. Angiographic images of the neck were obtained using MRA
technique without and with intravenous contrast.
CONTRAST:  6.8mL GADAVIST GADOBUTROL 1 MMOL/ML IV SOLN

[Series 5: DWI · axial · 3.0mm · 0.88mm/px · z∈[-92,+47]mm · 8 of 96 slices shown (1 of 4)]
[im 1/96]
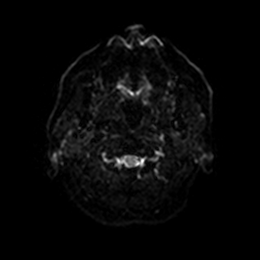
[im 14/96]
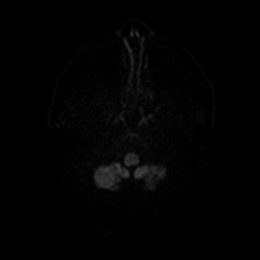
[im 28/96]
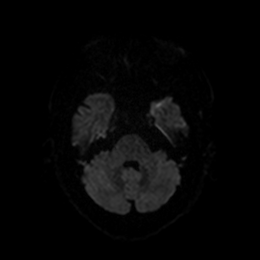
[im 41/96]
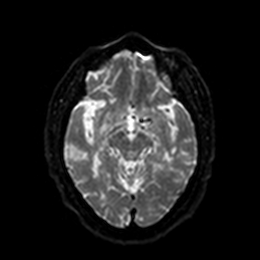
[im 55/96]
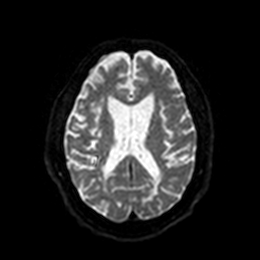
[im 68/96]
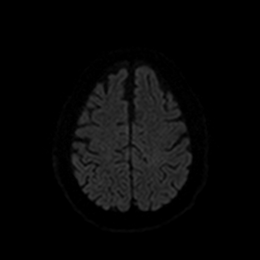
[im 82/96]
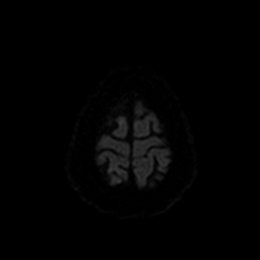
[im 96/96]
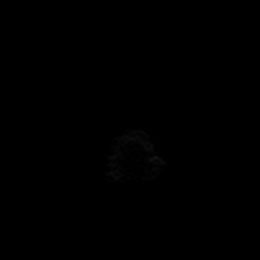

[Series 6: DWI · axial · 3.0mm · 0.88mm/px · z∈[-92,+47]mm · 4 of 48 slices shown (2 of 4)]
[im 1/48]
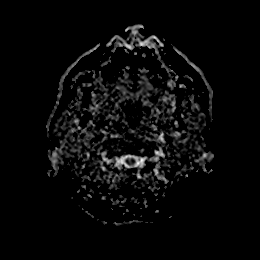
[im 16/48]
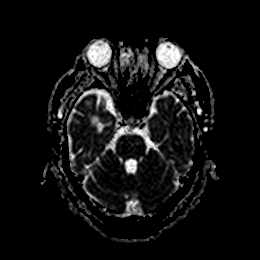
[im 32/48]
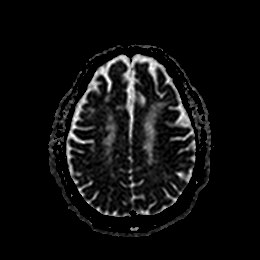
[im 48/48]
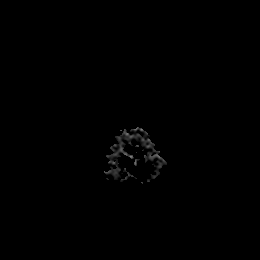

[Series 7: DWI · coronal · 4.0mm · 0.88mm/px · 6 of 72 slices shown (3 of 4)]
[im 1/72]
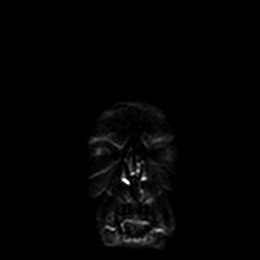
[im 15/72]
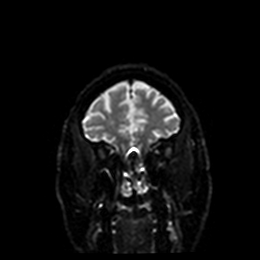
[im 29/72]
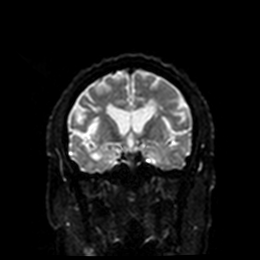
[im 43/72]
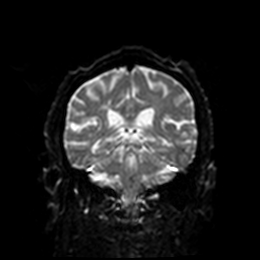
[im 57/72]
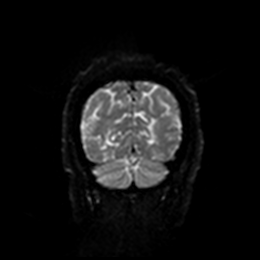
[im 72/72]
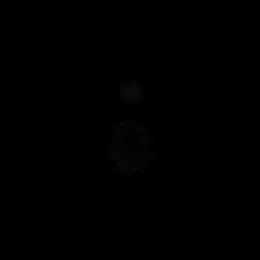

[Series 8: DWI · coronal · 4.0mm · 0.88mm/px · 3 of 36 slices shown (4 of 4)]
[im 1/36]
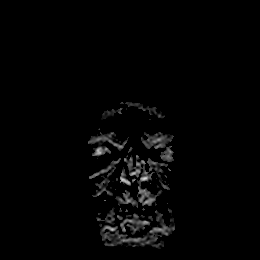
[im 18/36]
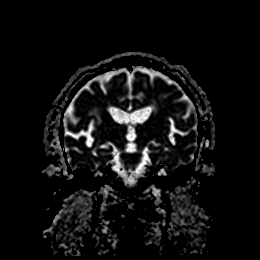
[im 36/36]
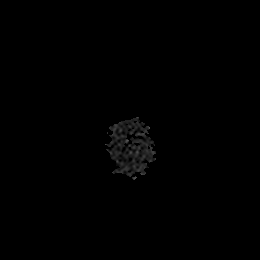

[Series 9: T1 · sagittal · 5.0mm · 0.75mm/px · 2 of 23 slices shown]
[im 1/23]
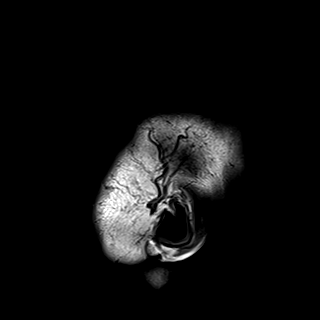
[im 23/23]
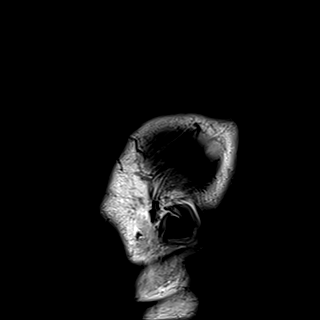

[Series 10: T2 · axial · 5.0mm · 0.72mm/px · z∈[-81,+50]mm · 2 of 23 slices shown (1 of 2)]
[im 1/23]
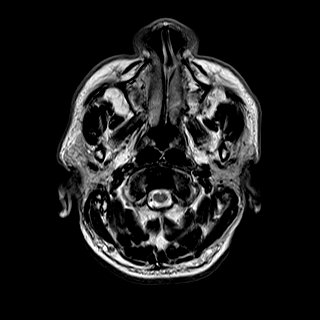
[im 23/23]
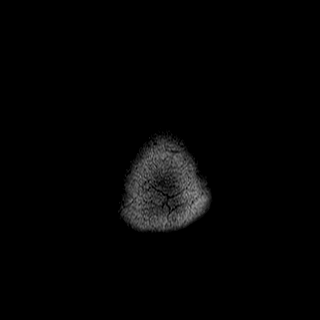

[Series 11: FLAIR · axial · 5.0mm · 0.45mm/px · z∈[-82,+49]mm · 2 of 23 slices shown]
[im 1/23]
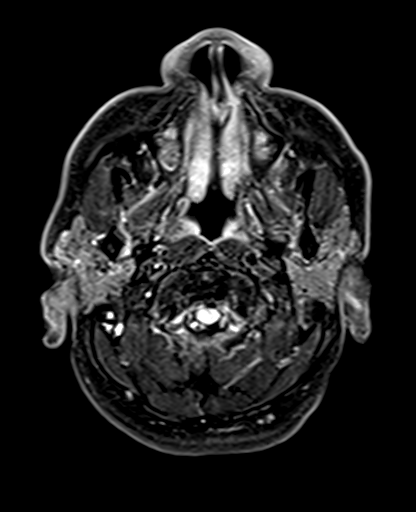
[im 23/23]
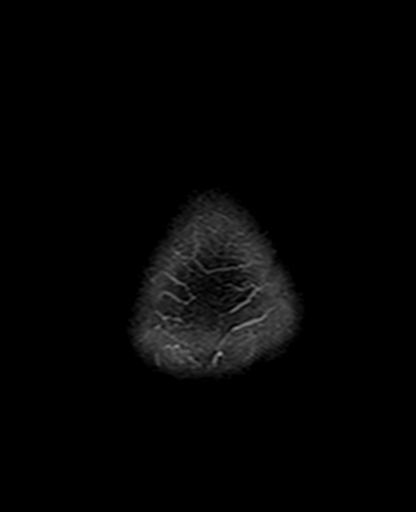

[Series 12: mag_images · axial · 3.0mm · 0.90mm/px · z∈[-86,+41]mm · 4 of 44 slices shown]
[im 1/44]
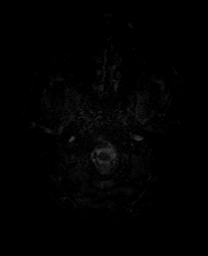
[im 15/44]
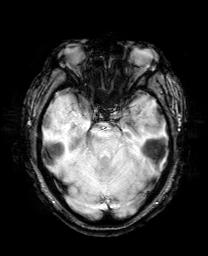
[im 29/44]
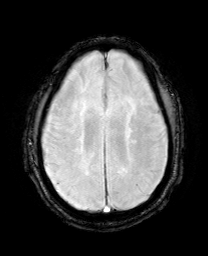
[im 44/44]
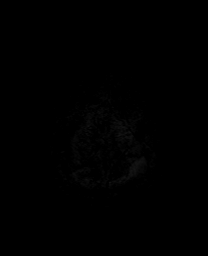

[Series 13: pha_images · axial · 3.0mm · 0.90mm/px · z∈[-84,+41]mm · 4 of 43 slices shown]
[im 1/43]
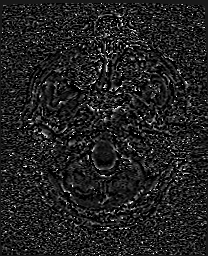
[im 15/43]
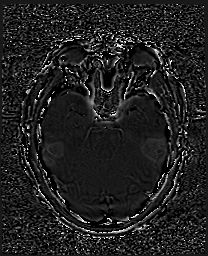
[im 29/43]
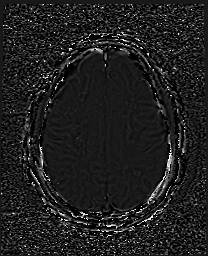
[im 43/43]
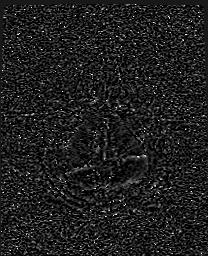

[Series 14: swi_images · axial · 3.0mm · 0.90mm/px · z∈[-86,+41]mm · 4 of 44 slices shown]
[im 1/44]
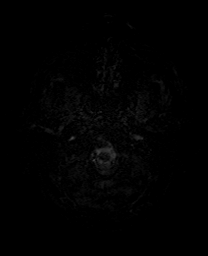
[im 15/44]
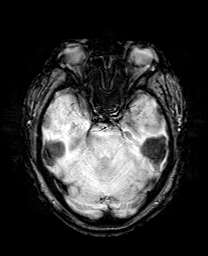
[im 29/44]
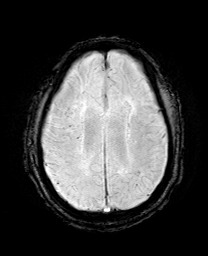
[im 44/44]
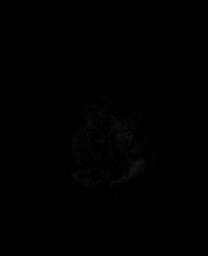

[Series 15: mip_images(sw) · axial · 24.0mm · 0.90mm/px · z∈[-76,+30]mm · 3 of 37 slices shown]
[im 1/37]
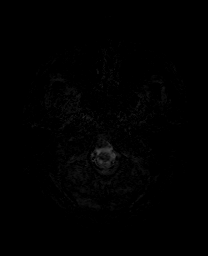
[im 19/37]
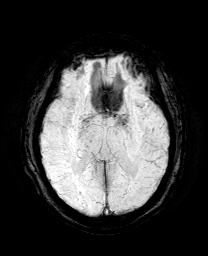
[im 37/37]
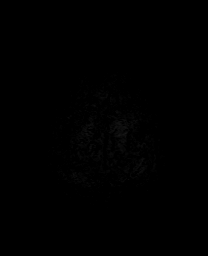

[Series 17: T2 · coronal · 5.0mm · 0.34mm/px · 2 of 29 slices shown (2 of 2)]
[im 1/29]
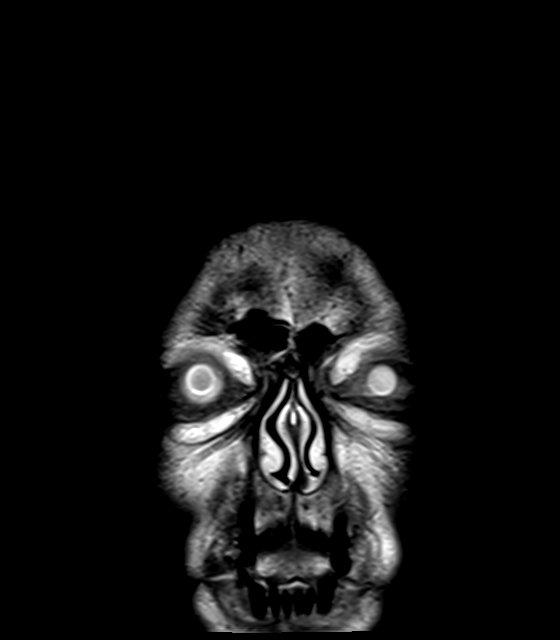
[im 29/29]
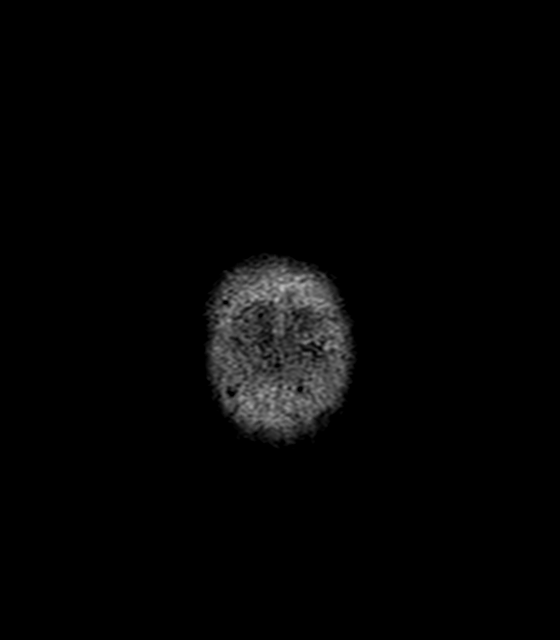

[44 of 48 positions shown; findings below may reference images not displayed]

FINDINGS: MR HEAD FINDINGS

Brain: Negative for acute infarct.

Moderate atrophy without hydrocephalus. Periventricular and deep
white matter hyperintensity bilaterally compatible with chronic
white matter disease. The patient has diabetes and hypertension in
this is most likely chronic microvascular ischemia although
demyelinating disease is in the differential. Negative for
hemorrhage or mass.

Vascular: Normal arterial flow voids

Skull and upper cervical spine: No focal skeletal lesion. ACDF at
C3-4.

Sinuses/Orbits: Mild mucosal edema paranasal sinuses. Negative orbit

Other: None

MR CIRCLE OF WILLIS FINDINGS

Both vertebral arteries widely patent to the basilar. PICA patent
bilaterally. Basilar patent. Left AICA patent. Bilateral superior
cerebellar and posterior cerebral arteries widely patent. Fetal
origin right posterior cerebral artery.

Internal carotid artery normal bilaterally without stenosis or
aneurysm. Middle cerebral artery widely patent bilaterally. Both
anterior cerebral arteries patent. Left anterior cerebral artery
large on the right. There is a mild to moderate stenosis of the
right A2 segment.

MRA NECK FINDINGS

Normal aortic arch with bovine branching. Proximal great vessels
widely patent.

Carotid bifurcation widely patent bilaterally without stenosis or
irregularity.

Both vertebral arteries widely patent without stenosis.
IMPRESSION: 1. Negative for acute infarct
2. Moderate atrophy. Extensive periventricular deep white matter
hyperintensities most likely chronic microvascular ischemia given
history of diabetes and hypertension.
3. Negative MRA neck.
4. Moderate stenosis right A2 segment. Remainder of the
circle-of-Willis is widely patent.

## 2020-04-01 MED ORDER — SODIUM CHLORIDE 0.9% FLUSH: 3.0000 mL | Freq: Once | INTRAVENOUS | Status: DC

## 2020-04-01 MED ORDER — GADOBUTROL 1 MMOL/ML IV SOLN
6.8000 mL | Freq: Once | INTRAVENOUS | Status: AC | PRN
  Administered 2020-04-01: 6.8 mL via INTRAVENOUS

## 2020-04-01 NOTE — ED Provider Notes (Signed)
[facility] EMERGENCY DEPARTMENT Provider Note   CSN: [id number] Arrival date & time: 04/01/20  1302  An emergency department physician performed an initial assessment on this suspected stroke patient at 38.  History Chief Complaint  Patient presents with  . Code Stroke    Travis Butler is a 59 y.o. male.  HPI 59 year old male presents as a code stroke.  History is from patient and EMS.  Originally called as a code stroke with facial weakness as well as some visual changes starting at noon.  However it turns out that this was noon yesterday according to the patient.  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  When doing the exam he notices some double vision when looking a certain way.  No headache.  No other acute illness.  Past Medical History:  Diagnosis Date  . Chronic constipation   . Diabetes mellitus without complication (HCC)   . Hypertension   . Ischemic cardiomyopathy   . Seizures (HCC)   . Stroke [facility])     There are no problems to display for this patient.   Past Surgical History:  Procedure Laterality Date  . TRANSURETHRAL RESECTION OF PROSTATE         No family history on file.  Social History   Tobacco Use  . Smoking status: Not on file  Substance Use Topics  . Alcohol use: Not on file  . Drug use: Not on file    Home Medications Prior to Admission medications   Not on File    Allergies    Patient has no allergy information on record.  Review of Systems   Review of Systems  Eyes: Positive for visual disturbance.  Neurological: Positive for speech difficulty and weakness. Negative for headaches.  All other systems reviewed and are negative.   Physical Exam Updated Vital Signs BP 117/83   Pulse 84   Temp 97.8 F (36.6 C) (Oral)   Resp 11   Ht 5' 2.5" (1.588 m)   Wt 68 kg   SpO2 100%   BMI 27.00 kg/m   Physical Exam Vitals and nursing note reviewed.    Constitutional:      General: He is not in acute distress.    Appearance: He is well-developed. He is not ill-appearing or diaphoretic.  HENT:     Head: Normocephalic and atraumatic.     Right Ear: External ear normal.     Left Ear: External ear normal.     Nose: Nose normal.  Eyes:     General:        Right eye: No discharge.        Left eye: No discharge.  Cardiovascular:     Rate and Rhythm: Normal rate and regular rhythm.     Heart sounds: Normal heart sounds.  Pulmonary:     Effort: Pulmonary effort is normal.     Breath sounds: Normal breath sounds.  Abdominal:     Palpations: Abdomen is soft.     Tenderness: There is no abdominal tenderness.  Genitourinary:    Comments: Foley catheter in place Musculoskeletal:     Cervical back: Neck supple.  Skin:    General: Skin is warm and dry.  Neurological:     Mental Status: He is alert.     Comments: Left sided facial droop. Unable to fully close left eyelid. It seems that he can at least partially move his left frontalis muscle. Gets  double vision when looking superiorly. 5/5 strength in RUE, 4/5 strength in LUE. Cannot move legs  Psychiatric:        Mood and Affect: Mood is not anxious.     ED Results / Procedures / Treatments   Labs (all labs ordered are listed, but only abnormal results are displayed) Labs Reviewed  APTT - Abnormal; Notable for the following components:      Result Value   aPTT 45 (*)    All other components within normal limits  CBC - Abnormal; Notable for the following components:   Hemoglobin 12.0 (*)    All other components within normal limits  COMPREHENSIVE METABOLIC PANEL - Abnormal; Notable for the following components:   Chloride 97 (*)    Creatinine, Ser 0.31 (*)    Albumin 3.4 (*)    All other components within normal limits  I-STAT CHEM 8, ED - Abnormal; Notable for the following components:   Chloride 96 (*)    Creatinine, Ser 0.30 (*)    Calcium, Ion 1.13 (*)    All other  components within normal limits  SARS CORONAVIRUS 2 BY RT PCR (HOSPITAL ORDER, PERFORMED IN [facility])  URINE CULTURE  PROTIME-INR  DIFFERENTIAL  URINALYSIS, ROUTINE W REFLEX MICROSCOPIC  CBG MONITORING, ED    EKG EKG Interpretation  Date/Time:  Wednesday April 01 2020 13:19:37 EDT Ventricular Rate:  85 PR Interval:    QRS Duration: 87 QT Interval:  332 QTC Calculation: 395 R Axis:   57 Text Interpretation: Sinus rhythm Nonspecific T abnormalities, lateral leads No old tracing to compare Confirmed by Pricilla Loveless [phone]) on 04/01/2020 1:26:05 PM   Radiology MR ANGIO HEAD WO CONTRAST  Result Date: 04/01/2020 CLINICAL DATA:  Acute neurologic deficit.  Left-sided weakness. EXAM: MR HEAD WITHOUT CONTRAST MR CIRCLE OF WILLIS WITHOUT CONTRAST MRA OF THE NECK WITHOUT AND WITH CONTRAST TECHNIQUE: Multiplanar, multiecho pulse sequences of the brain, circle of willis and surrounding structures were obtained without intravenous contrast. Angiographic images of the neck were obtained using MRA technique without and with intravenous contrast. CONTRAST:  6.98mL GADAVIST GADOBUTROL 1 MMOL/ML IV SOLN COMPARISON:  CT head 04/01/2020 FINDINGS: MR HEAD FINDINGS Brain: Negative for acute infarct. Moderate atrophy without hydrocephalus. Periventricular and deep white matter hyperintensity bilaterally compatible with chronic white matter disease. The patient has diabetes and hypertension in this is most likely chronic microvascular ischemia although demyelinating disease is in the differential. Negative for hemorrhage or mass. Vascular: Normal arterial flow voids Skull and upper cervical spine: No focal skeletal lesion. ACDF at C3-4. Sinuses/Orbits: Mild mucosal edema paranasal sinuses. Negative orbit Other: None MR CIRCLE OF WILLIS FINDINGS Both vertebral arteries widely patent to the basilar. PICA patent bilaterally. Basilar patent. Left AICA patent. Bilateral superior cerebellar and posterior  cerebral arteries widely patent. Fetal origin right posterior cerebral artery. Internal carotid artery normal bilaterally without stenosis or aneurysm. Middle cerebral artery widely patent bilaterally. Both anterior cerebral arteries patent. Left anterior cerebral artery large on the right. There is a mild to moderate stenosis of the right A2 segment. MRA NECK FINDINGS Normal aortic arch with bovine branching. Proximal great vessels widely patent. Carotid bifurcation widely patent bilaterally without stenosis or irregularity. Both vertebral arteries widely patent without stenosis. IMPRESSION: 1. Negative for acute infarct 2. Moderate atrophy. Extensive periventricular deep white matter hyperintensities most likely chronic microvascular ischemia given history of diabetes and hypertension. 3. Negative MRA neck. 4. Moderate stenosis right A2 segment. Remainder of the circle-of-Willis is widely  patent. Electronically Signed   By: Marlan Palauharles  Clark M.D.   On: 04/01/2020 15:07   MR ANGIO NECK W WO CONTRAST  Result Date: 04/01/2020 CLINICAL DATA:  Acute neurologic deficit.  Left-sided weakness. EXAM: MR HEAD WITHOUT CONTRAST MR CIRCLE OF WILLIS WITHOUT CONTRAST MRA OF THE NECK WITHOUT AND WITH CONTRAST TECHNIQUE: Multiplanar, multiecho pulse sequences of the brain, circle of willis and surrounding structures were obtained without intravenous contrast. Angiographic images of the neck were obtained using MRA technique without and with intravenous contrast. CONTRAST:  6.698mL GADAVIST GADOBUTROL 1 MMOL/ML IV SOLN COMPARISON:  CT head 04/01/2020 FINDINGS: MR HEAD FINDINGS Brain: Negative for acute infarct. Moderate atrophy without hydrocephalus. Periventricular and deep white matter hyperintensity bilaterally compatible with chronic white matter disease. The patient has diabetes and hypertension in this is most likely chronic microvascular ischemia although demyelinating disease is in the differential. Negative for hemorrhage  or mass. Vascular: Normal arterial flow voids Skull and upper cervical spine: No focal skeletal lesion. ACDF at C3-4. Sinuses/Orbits: Mild mucosal edema paranasal sinuses. Negative orbit Other: None MR CIRCLE OF WILLIS FINDINGS Both vertebral arteries widely patent to the basilar. PICA patent bilaterally. Basilar patent. Left AICA patent. Bilateral superior cerebellar and posterior cerebral arteries widely patent. Fetal origin right posterior cerebral artery. Internal carotid artery normal bilaterally without stenosis or aneurysm. Middle cerebral artery widely patent bilaterally. Both anterior cerebral arteries patent. Left anterior cerebral artery large on the right. There is a mild to moderate stenosis of the right A2 segment. MRA NECK FINDINGS Normal aortic arch with bovine branching. Proximal great vessels widely patent. Carotid bifurcation widely patent bilaterally without stenosis or irregularity. Both vertebral arteries widely patent without stenosis. IMPRESSION: 1. Negative for acute infarct 2. Moderate atrophy. Extensive periventricular deep white matter hyperintensities most likely chronic microvascular ischemia given history of diabetes and hypertension. 3. Negative MRA neck. 4. Moderate stenosis right A2 segment. Remainder of the circle-of-Willis is widely patent. Electronically Signed   By: Marlan Palauharles  Clark M.D.   On: 04/01/2020 15:07   MR BRAIN WO CONTRAST  Result Date: 04/01/2020 CLINICAL DATA:  Acute neurologic deficit.  Left-sided weakness. EXAM: MR HEAD WITHOUT CONTRAST MR CIRCLE OF WILLIS WITHOUT CONTRAST MRA OF THE NECK WITHOUT AND WITH CONTRAST TECHNIQUE: Multiplanar, multiecho pulse sequences of the brain, circle of willis and surrounding structures were obtained without intravenous contrast. Angiographic images of the neck were obtained using MRA technique without and with intravenous contrast. CONTRAST:  6.268mL GADAVIST GADOBUTROL 1 MMOL/ML IV SOLN COMPARISON:  CT head 04/01/2020 FINDINGS:  MR HEAD FINDINGS Brain: Negative for acute infarct. Moderate atrophy without hydrocephalus. Periventricular and deep white matter hyperintensity bilaterally compatible with chronic white matter disease. The patient has diabetes and hypertension in this is most likely chronic microvascular ischemia although demyelinating disease is in the differential. Negative for hemorrhage or mass. Vascular: Normal arterial flow voids Skull and upper cervical spine: No focal skeletal lesion. ACDF at C3-4. Sinuses/Orbits: Mild mucosal edema paranasal sinuses. Negative orbit Other: None MR CIRCLE OF WILLIS FINDINGS Both vertebral arteries widely patent to the basilar. PICA patent bilaterally. Basilar patent. Left AICA patent. Bilateral superior cerebellar and posterior cerebral arteries widely patent. Fetal origin right posterior cerebral artery. Internal carotid artery normal bilaterally without stenosis or aneurysm. Middle cerebral artery widely patent bilaterally. Both anterior cerebral arteries patent. Left anterior cerebral artery large on the right. There is a mild to moderate stenosis of the right A2 segment. MRA NECK FINDINGS Normal aortic arch with bovine branching. Proximal great vessels  widely patent. Carotid bifurcation widely patent bilaterally without stenosis or irregularity. Both vertebral arteries widely patent without stenosis. IMPRESSION: 1. Negative for acute infarct 2. Moderate atrophy. Extensive periventricular deep white matter hyperintensities most likely chronic microvascular ischemia given history of diabetes and hypertension. 3. Negative MRA neck. 4. Moderate stenosis right A2 segment. Remainder of the circle-of-Willis is widely patent. Electronically Signed   By: Marlan Palau M.D.   On: 04/01/2020 15:07   CT HEAD CODE STROKE WO CONTRAST  Result Date: 04/01/2020 CLINICAL DATA:  Code stroke. 59 year old male with left side weakness. Last seen well at noon. EXAM: CT HEAD WITHOUT CONTRAST TECHNIQUE:  Contiguous axial images were obtained from the base of the skull through the vertex without intravenous contrast. COMPARISON:  Head CT 02/23/2018. FINDINGS: Brain: Stable mild dural calcification. No midline shift, mass effect, or evidence of intracranial mass lesion. No ventriculomegaly. No acute intracranial hemorrhage identified. No cortically based acute infarct identified. Increased patchy and scattered bilateral white matter hypodensity since 2009, including some deep white matter capsule involvement in both hemispheres. No cortical encephalomalacia identified. Vascular: No suspicious intracranial vascular hyperdensity. Skull: No acute osseous abnormality identified. Sinuses/Orbits: Mild new ethmoid sinus mucosal thickening. Other paranasal sinuses are stable and well pneumatized. Chronic right mastoid effusion is stable. Tympanic cavities remain clear. Other: No acute orbit or scalp soft tissue finding. ASPECTS [facility] Stroke Program Early CT Score) Total score (0-10 with 10 being normal): 10 IMPRESSION: 1. No acute cortically based infarct or acute intracranial hemorrhage identified. ASPECTS 10. 2. Increased cerebral white matter disease since 2019, most commonly due to chronic small vessel disease. 3. These results were communicated to Dr. Amada Jupiter at 1:19 pm on 04/01/2020 by text page via the [facility] messaging system. Electronically Signed   By: Odessa Fleming M.D.   On: 04/01/2020 13:19    Procedures Procedures (including critical care time)  Medications Ordered in ED Medications  sodium chloride flush (NS) 0.9 % injection 3 mL (has no administration in time range)  gadobutrol (GADAVIST) 1 MMOL/ML injection 6.8 mL (6.8 mLs Intravenous Contrast Given 04/01/20 1441)    ED Course  I have reviewed the triage vital signs and the nursing notes.  Pertinent labs & imaging results that were available during my care of the patient were reviewed by me and considered in my medical decision making (see chart  for details).    MDM Rules/Calculators/A&P                          His MRI and other work-up including CT and labs are benign.  Does not seem consistent was acute stroke although he could have a recrudescence of his old stroke symptoms.  I discussed with his primary provider at [facility], Dr. Leonia Reader, who has been made aware of the negative work-up and accepts him back to [facility].  He will do a urinalysis, which has not been done yet and look for UTI. Final Clinical Impression(s) / ED Diagnoses Final diagnoses:  Left-sided weakness    Rx / DC Orders ED Discharge Orders    None       Pricilla Loveless, MD 04/01/20 [phone]

## 2020-04-01 NOTE — Code Documentation (Signed)
Stroke Response Nurse Documentation Code Documentation  Travis Butler is a 59 y.o. male arriving to [facility] ED via [facility] on 04/01/2020 with past medical hx of stroke with PEG and Trach. Code stroke was activated by [facility] Patient from [facility] where he was LKW yesterday at 1200 and now complaining of sensory changes and some slurred speech. Pt has baseline left sided weakness from prior stroke and does not have use of his legs at baseline. mRS 5. Stroke team at the bedside on patient arrival. Labs drawn and patient cleared for CT by Dr.Goldston. Patient to CT with team. NIHSS 11, see documentation for details and code stroke times. Patient with left facial droop, left arm weakness, bilateral leg weakness and dysarthria  on exam. The following imaging was completed:  CT. Patient is not a candidate for tPA due to being outside window. Care/Plan: q2 mNIHSS/VS and MRI. Bedside handoff with ED RN Steward Drone.    Lucila Maine  Stroke Response RN

## 2020-04-01 NOTE — ED Triage Notes (Signed)
PT here via [facility] from [facility].  Pt states yesterday at noon pt couldn't feel the food on the L side of his tongue and he had difficulty swallowing.  He also states double vision.  AO x 4.

## 2020-04-01 NOTE — Consult Note (Signed)
Neurology Consultation Reason for Consult: Stroke Referring Physician: Criss Alvine, S  CC: Numbness  History is obtained from: Patient  HPI: Travis Butler is a 59 y.o. male with a history of diabetes, hypertension who presents with new onset diplopia and worsening left-sided numbness.  He has a history of chronic left-sided deficits from a previous stroke as well as ischemic cardiomyopathy, diabetes.  He states that he started noticing that he was having more difficulty chewing and swallowing yesterday, and then noticed that his left side seem more weak.  He also noticed new blurred vision that becomes double if he looks to the side.   LKW: 8/17 around noon tpa given?: no, outside window    ROS: A 14 point ROS was performed and is negative except as noted in the HPI.   Past Medical History:  Diagnosis Date  . Chronic constipation   . Diabetes mellitus without complication (HCC)   . Hypertension   . Ischemic cardiomyopathy   . Seizures (HCC)   . Stroke [facility])      No family history on file.   Social History:  has no history on file for tobacco use, alcohol use, and drug use.   Exam: Current vital signs: BP 117/83   Pulse 84   Temp 97.8 F (36.6 C) (Oral)   Resp 11   Ht 5' 2.5" (1.588 m)   Wt 68 kg   SpO2 100%   BMI 27.00 kg/m  Vital signs in last 24 hours: Temp:  [97.8 F (36.6 C)] 97.8 F (36.6 C) (08/18 1322) Pulse Rate:  [84] 84 (08/18 1322) Resp:  [11] 11 (08/18 1322) BP: (117)/(83) 117/83 (08/18 1322) SpO2:  [100 %] 100 % (08/18 1322) Weight:  [68 kg] 68 kg (08/18 1323)   Physical Exam  Constitutional: Appears well-developed and well-nourished.  Psych: Affect appropriate to situation Eyes: No scleral injection HENT: No OP obstrucion MSK: no joint deformities.  Cardiovascular: Normal rate and regular rhythm.  Respiratory: Effort normal, non-labored breathing GI: Soft.  No distension. There is no tenderness.  Skin: WDI  Neuro: Mental Status: Patient  is awake, alert, oriented to person, place, month, year, and situation. Patient is able to give a clear and coherent history. No signs of aphasia  Cranial Nerves: II: Visual Fields are full. Pupils are equal, round, and reactive to light.   III,IV, VI: He has impaired adduction on lateral gaze bilaterally,?  Bilateral INO V: Facial sensation is symmetric to temperature VII: Facial movement with significant left facial weakness VIII: hearing is intact to voice X: Uvula elevates symmetrically XI: Shoulder shrug is symmetric. XII: tongue is midline without atrophy or fasciculations.  Motor: Tone is normal. Bulk is normal. 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 Sensory: Sensation is mildly diminished in the left Cerebellar: Unable to perform on the left   I have reviewed labs in epic and the results pertinent to this consultation are: Creatinine 0.3  I have reviewed the images obtained: CT head-no acute findings  Impression: 59 year old male with worsening of his underlying stroke symptoms.  Possibilities include recrudescence of previous symptoms versus new acute ischemic stroke.  I would favor getting an MRI and if it does show new ischemic stroke then he may need reevaluation for radiology.  Recommendations: 1) MRI brain, MRA head and neck 2) further  recommendations following above imaging   Ritta Slot, MD [facility] [phone]  If 7pm- 7am, please page neurology on call as listed in AMION.

## 2020-04-01 NOTE — ED Notes (Signed)
Called [facility] for Transport  °

## 2020-04-01 NOTE — Discharge Instructions (Addendum)
Your labs, CT scan, and MRI do not show any acute abnormalities.  You should get your urine checked to make sure there is no UTI.
